# Patient Record
Sex: Female | Born: 1950 | Race: White | Hispanic: No | Smoking: Never smoker
Health system: Southern US, Community
[De-identification: ages and names within clinical notes are randomized; demographics above are authoritative.]

## PROBLEM LIST (undated history)

## (undated) DIAGNOSIS — I10 Essential (primary) hypertension: Secondary | ICD-10-CM

## (undated) DIAGNOSIS — E119 Type 2 diabetes mellitus without complications: Secondary | ICD-10-CM

## (undated) DIAGNOSIS — F32A Depression, unspecified: Secondary | ICD-10-CM

## (undated) DIAGNOSIS — I878 Other specified disorders of veins: Secondary | ICD-10-CM

## (undated) DIAGNOSIS — N183 Chronic kidney disease, stage 3 unspecified: Secondary | ICD-10-CM

## (undated) DIAGNOSIS — R001 Bradycardia, unspecified: Secondary | ICD-10-CM

## (undated) DIAGNOSIS — M109 Gout, unspecified: Secondary | ICD-10-CM

## (undated) DIAGNOSIS — I2510 Atherosclerotic heart disease of native coronary artery without angina pectoris: Secondary | ICD-10-CM

## (undated) DIAGNOSIS — G629 Polyneuropathy, unspecified: Secondary | ICD-10-CM

## (undated) HISTORY — PX: NASAL SEPTUM SURGERY: SHX37

---

## 2012-01-20 DIAGNOSIS — Z950 Presence of cardiac pacemaker: Secondary | ICD-10-CM

## 2012-01-20 HISTORY — DX: Presence of cardiac pacemaker: Z95.0

## 2012-01-20 HISTORY — PX: INSERT / REPLACE / REMOVE PACEMAKER: SUR710

## 2017-05-12 ENCOUNTER — Ambulatory Visit: Payer: Self-pay | Admitting: Primary Care

## 2017-05-25 ENCOUNTER — Other Ambulatory Visit: Payer: Self-pay

## 2017-05-25 ENCOUNTER — Ambulatory Visit: Payer: Self-pay

## 2017-05-25 ENCOUNTER — Ambulatory Visit (INDEPENDENT_AMBULATORY_CARE_PROVIDER_SITE_OTHER): Payer: Medicare Other | Admitting: Podiatry

## 2017-05-25 DIAGNOSIS — B351 Tinea unguium: Secondary | ICD-10-CM

## 2017-05-25 DIAGNOSIS — M79676 Pain in unspecified toe(s): Secondary | ICD-10-CM

## 2017-05-25 DIAGNOSIS — E0843 Diabetes mellitus due to underlying condition with diabetic autonomic (poly)neuropathy: Secondary | ICD-10-CM | POA: Diagnosis not present

## 2017-05-25 DIAGNOSIS — L02611 Cutaneous abscess of right foot: Secondary | ICD-10-CM

## 2017-05-25 DIAGNOSIS — L03031 Cellulitis of right toe: Principal | ICD-10-CM

## 2017-05-27 NOTE — Progress Notes (Signed)
   SUBJECTIVE Patient with a history of diabetes mellitus presents to office today complaining of elongated, thickened nails that cause pain while ambulating in shoes. She is unable to trim her own nails. Patient is here for further evaluation and treatment.   No past medical history on file.  OBJECTIVE General Patient is awake, alert, and oriented x 3 and in no acute distress. Derm Skin is dry and supple bilateral. Negative open lesions or macerations. Remaining integument unremarkable. Nails are tender, long, thickened and dystrophic with subungual debris, consistent with onychomycosis, 1-5 bilateral. No signs of infection noted. Vasc  DP and PT pedal pulses palpable bilaterally. Temperature gradient within normal limits.  Neuro Epicritic and protective threshold sensation diminished bilaterally.  Musculoskeletal Exam No symptomatic pedal deformities noted bilateral. Muscular strength within normal limits.  ASSESSMENT 1. Diabetes Mellitus w/ peripheral neuropathy 2. Onychomycosis of nail due to dermatophyte bilateral 3. Pain in foot bilateral  PLAN OF CARE 1. Patient evaluated today. 2. Instructed to maintain good pedal hygiene and foot care. Stressed importance of controlling blood sugar.  3. Mechanical debridement of nails 1-5 bilaterally performed using a nail nipper. Filed with dremel without incident.  4. Return to clinic in 3 mos.     Felecia Shelling, DPM [facility]  Dr. Felecia Shelling, DPM    [address]                                        [place], [place] [address]                Office [phone]  Fax [phone]

## 2017-06-09 ENCOUNTER — Ambulatory Visit: Payer: Self-pay | Admitting: Family Medicine

## 2017-06-28 ENCOUNTER — Ambulatory Visit: Payer: Self-pay | Admitting: Family Medicine

## 2017-08-26 ENCOUNTER — Encounter: Payer: Self-pay | Admitting: Podiatry

## 2017-08-26 ENCOUNTER — Ambulatory Visit (INDEPENDENT_AMBULATORY_CARE_PROVIDER_SITE_OTHER): Payer: Medicare Other | Admitting: Podiatry

## 2017-08-26 DIAGNOSIS — B351 Tinea unguium: Secondary | ICD-10-CM

## 2017-08-26 DIAGNOSIS — E0843 Diabetes mellitus due to underlying condition with diabetic autonomic (poly)neuropathy: Secondary | ICD-10-CM

## 2017-08-26 DIAGNOSIS — M79676 Pain in unspecified toe(s): Secondary | ICD-10-CM

## 2017-08-26 NOTE — Progress Notes (Signed)
Complaint:  Visit Type: Patient returns to my office for continued preventative foot care services. Complaint: Patient states" my nails have grown long and thick and become painful to walk and wear shoes"  The patient presents for preventative foot care services. No changes to ROS.  Patient says her compression socks are working well.  Podiatric Exam: Vascular: dorsalis pedis and posterior tibial pulses are palpable bilateral. Capillary return is immediate. Temperature gradient is WNL. Skin turgor WNL  Sensorium: Normal Semmes Weinstein monofilament test. Normal tactile sensation bilaterally. Nail Exam: Pt has thick disfigured discolored nails with subungual debris noted bilateral entire nail hallux through fifth toenails Ulcer Exam: There is no evidence of ulcer or pre-ulcerative changes or infection. Orthopedic Exam: Muscle tone and strength are WNL. No limitations in general ROM. No crepitus or effusions noted. Foot type and digits show no abnormalities. HAV  B/L. Skin: No Porokeratosis. No infection or ulcers.  Asymptomatic callus sub 1  B/l.  Diagnosis:  Onychomycosis, , Pain in right toe, pain in left toes  Treatment & Plan Procedures and Treatment: Consent by patient was obtained for treatment procedures.   Debridement of mycotic and hypertrophic toenails, 1 through 5 bilateral and clearing of subungual debris. No ulceration, no infection noted. ABN signed for 2019. Return Visit-Office Procedure: Patient instructed to return to the office for a follow up visit 3 months for continued evaluation and treatment.    Helane GuntherGregory Jahira Swiss DPM

## 2017-11-15 ENCOUNTER — Other Ambulatory Visit: Payer: Self-pay

## 2017-11-15 ENCOUNTER — Emergency Department
Admission: EM | Admit: 2017-11-15 | Discharge: 2017-11-16 | Disposition: A | Discharge status: Home / Self Care | Payer: Medicare Other | Attending: Emergency Medicine | Admitting: Emergency Medicine

## 2017-11-15 ENCOUNTER — Encounter: Payer: Self-pay | Admitting: Emergency Medicine

## 2017-11-15 DIAGNOSIS — E860 Dehydration: Secondary | ICD-10-CM | POA: Insufficient documentation

## 2017-11-15 DIAGNOSIS — Z91040 Latex allergy status: Secondary | ICD-10-CM | POA: Insufficient documentation

## 2017-11-15 DIAGNOSIS — R739 Hyperglycemia, unspecified: Secondary | ICD-10-CM

## 2017-11-15 DIAGNOSIS — E1165 Type 2 diabetes mellitus with hyperglycemia: Secondary | ICD-10-CM | POA: Insufficient documentation

## 2017-11-15 DIAGNOSIS — Z7982 Long term (current) use of aspirin: Secondary | ICD-10-CM | POA: Diagnosis not present

## 2017-11-15 DIAGNOSIS — Z79899 Other long term (current) drug therapy: Secondary | ICD-10-CM | POA: Insufficient documentation

## 2017-11-15 HISTORY — DX: Type 2 diabetes mellitus without complications: E11.9

## 2017-11-15 LAB — URINALYSIS, COMPLETE (UACMP) WITH MICROSCOPIC
BACTERIA UA: NONE SEEN
Bilirubin Urine: NEGATIVE
Hgb urine dipstick: NEGATIVE
KETONES UR: NEGATIVE mg/dL
Leukocytes, UA: NEGATIVE
NITRITE: NEGATIVE
Protein, ur: NEGATIVE mg/dL
SPECIFIC GRAVITY, URINE: 1.022 (ref 1.005–1.030)
pH: 5 (ref 5.0–8.0)

## 2017-11-15 LAB — CBC
HEMATOCRIT: 45.6 % (ref 36.0–46.0)
Hemoglobin: 15.4 g/dL — ABNORMAL HIGH (ref 12.0–15.0)
MCH: 30.1 pg (ref 26.0–34.0)
MCHC: 33.8 g/dL (ref 30.0–36.0)
MCV: 89.1 fL (ref 80.0–100.0)
Platelets: 249 10*3/uL (ref 150–400)
RBC: 5.12 MIL/uL — ABNORMAL HIGH (ref 3.87–5.11)
RDW: 11.4 % — ABNORMAL LOW (ref 11.5–15.5)
WBC: 9.9 10*3/uL (ref 4.0–10.5)
nRBC: 0 % (ref 0.0–0.2)

## 2017-11-15 LAB — BASIC METABOLIC PANEL
Anion gap: 14 (ref 5–15)
BUN: 36 mg/dL — AB (ref 8–23)
CHLORIDE: 90 mmol/L — AB (ref 98–111)
CO2: 21 mmol/L — ABNORMAL LOW (ref 22–32)
CREATININE: 1.77 mg/dL — AB (ref 0.44–1.00)
Calcium: 9.4 mg/dL (ref 8.9–10.3)
GFR calc Af Amer: 34 mL/min — ABNORMAL LOW (ref >60)
GFR calc non Af Amer: 29 mL/min — ABNORMAL LOW (ref >60)
Glucose, Bld: 772 mg/dL (ref 70–99)
Potassium: 4.3 mmol/L (ref 3.5–5.1)
SODIUM: 125 mmol/L — AB (ref 135–145)

## 2017-11-15 LAB — GLUCOSE, CAPILLARY
GLUCOSE-CAPILLARY: 293 mg/dL — AB (ref 70–99)
Glucose-Capillary: >600 mg/dL (ref 70–99)
Glucose-Capillary: >600 mg/dL (ref 70–99)
Glucose-Capillary: 509 mg/dL (ref 70–99)

## 2017-11-15 MED ORDER — SODIUM CHLORIDE 0.9 % IV BOLUS: 1000.0000 mL | Freq: Once | INTRAVENOUS | Status: DC

## 2017-11-15 MED ORDER — NATEGLINIDE 120 MG PO TABS: 60.0000 mg | ORAL_TABLET | Freq: Once | ORAL | Status: DC

## 2017-11-15 MED ORDER — SODIUM CHLORIDE 0.9 % IV BOLUS
1000.0000 mL | Freq: Once | INTRAVENOUS | Status: AC
  Administered 2017-11-15: 1000 mL via INTRAVENOUS

## 2017-11-15 MED ORDER — SODIUM CHLORIDE 0.9 % IV SOLN
Freq: Once | INTRAVENOUS | Status: AC
  Administered 2017-11-15: 23:00:00 via INTRAVENOUS

## 2017-11-15 MED ORDER — INSULIN ASPART 100 UNIT/ML ~~LOC~~ SOLN
10.0000 [IU] | Freq: Once | SUBCUTANEOUS | Status: AC
  Administered 2017-11-15: 10 [IU] via INTRAVENOUS
  Filled 2017-11-15: qty 1

## 2017-11-15 MED ORDER — INSULIN ASPART 100 UNIT/ML ~~LOC~~ SOLN
SUBCUTANEOUS | Status: AC
  Administered 2017-11-15: 10 [IU] via INTRAVENOUS
  Filled 2017-11-15: qty 1

## 2017-11-15 MED ORDER — INSULIN ASPART 100 UNIT/ML ~~LOC~~ SOLN
10.0000 [IU] | Freq: Once | SUBCUTANEOUS | Status: AC
  Administered 2017-11-15: 10 [IU] via INTRAVENOUS

## 2017-11-15 MED ORDER — NATEGLINIDE 60 MG PO TABS: 60.0000 mg | ORAL_TABLET | Freq: Three times a day (TID) | ORAL | 1 refills | Status: DC

## 2017-11-15 NOTE — ED Triage Notes (Signed)
Pt via pov from [facility] with hyperglycemia. She had routine appt today and was called back and told that her blood glucose was over 700. Pt has recently moved to the area and has not established care with a local physician. She has been on (possibly) glipizide until she ran out in march or April. Pt alert & oriented; nad noted.

## 2017-11-15 NOTE — ED Notes (Signed)
Pt with no change in condition, no complaints at this time.

## 2017-11-15 NOTE — ED Provider Notes (Addendum)
[facility] Emergency Department Provider Note  ____________________________________________  Time seen: Approximately 11:06 PM  I have reviewed the triage vital signs and the nursing notes.   HISTORY  Chief Complaint Hyperglycemia   HPI Debbie Wiggins is a 67 y.o. female with a history of type 2 diabetes who presents for evaluation of a hyperglycemia.  Patient reports that she recently moved from [place] to [place] and lost her insurance.  She has been off of her oral glycemic agents since April.  She recently was able to get insurance again and today had a regular PCP appointment with labs drawn. Patient was called by the PCP this afternoon telling her to come to the emergency room for sugar greater than 700.  Patient denies abdominal pain, chest pain, dizziness, headache, confusion.  She does report polyuria polydipsia however she says that is chronic for her.  Patient reports that her last creatinine in [place] was 1.2.   Past Medical History:  Diagnosis Date  . Diabetes mellitus without complication (HCC)     Prior to Admission medications   Medication Sig Start Date End Date Taking? Authorizing Provider  aspirin EC 81 MG tablet Take 81 mg by mouth daily.    [provider]  carvedilol (COREG) 6.25 MG tablet Take 6.25 mg by mouth 2 (two) times daily with a meal.    [provider]  colchicine 0.6 MG tablet Take 0.6 mg by mouth daily.    [provider]  hydrochlorothiazide (HYDRODIURIL) 25 MG tablet Take 25 mg by mouth daily. 05/10/17   [provider]  latanoprost (XALATAN) 0.005 % ophthalmic solution  05/13/17   [provider]  losartan (COZAAR) 100 MG tablet Take 100 mg by mouth daily. 05/10/17   [provider]  nateglinide (STARLIX) 60 MG tablet Take 1 tablet (60 mg total) by mouth 3 (three) times daily with meals. 11/15/17   Nita Sickle, MD  rosuvastatin (CRESTOR) 10 MG tablet Take 10  mg by mouth daily.    [provider]  SODIUM BICARBONATE PO Take by mouth.    [provider]    Allergies Ceftin [cefuroxime axetil]; Latex; Penicillins; and Sulfa antibiotics  History reviewed. No pertinent family history.  Social History Social History   Tobacco Use  . Smoking status: Never Smoker  . Smokeless tobacco: Never Used  Substance Use Topics  . Alcohol use: Never    Frequency: Never  . Drug use: Never    Review of Systems  Constitutional: Negative for fever. Eyes: Negative for visual changes. ENT: Negative for sore throat. Neck: No neck pain  Cardiovascular: Negative for chest pain. Respiratory: Negative for shortness of breath. Gastrointestinal: Negative for abdominal pain, vomiting or diarrhea. Genitourinary: Negative for dysuria. Musculoskeletal: Negative for back pain. Skin: Negative for rash. Neurological: Negative for headaches, weakness or numbness. Psych: No SI or HI  ____________________________________________   PHYSICAL EXAM:  VITAL SIGNS: ED Triage Vitals  Enc Vitals Group     BP 11/15/17 1801 100/61     Pulse Rate 11/15/17 1801 90     Resp --      Temp 11/15/17 1801 98.9 F (37.2 C)     Temp Source 11/15/17 1801 Oral     SpO2 11/15/17 1801 93 %     Weight 11/15/17 1802 247 lb (112 kg)     Height 11/15/17 1802 5\' 5"  (1.651 m)     Head Circumference --      Peak Flow --  Pain Score 11/15/17 1823 0     Pain Loc --      Pain Edu? --      Excl. in GC? --     Constitutional: Alert and oriented. Well appearing and in no apparent distress. HEENT:      Head: Normocephalic and atraumatic.         Eyes: Conjunctivae are normal. Sclera is non-icteric.       Mouth/Throat: Mucous membranes are moist.       Neck: Supple with no signs of meningismus. Cardiovascular: Regular rate and rhythm. No murmurs, gallops, or rubs. 2+ symmetrical distal pulses are present in all extremities. No JVD. Respiratory: Normal  respiratory effort. Lungs are clear to auscultation bilaterally. No wheezes, crackles, or rhonchi.  Gastrointestinal: Soft, non tender, and non distended with positive bowel sounds. No rebound or guarding. Musculoskeletal: Nontender with normal range of motion in all extremities. No edema, cyanosis, or erythema of extremities. Neurologic: Normal speech and language. Face is symmetric. Moving all extremities. No gross focal neurologic deficits are appreciated. Skin: Skin is warm, dry and intact. No rash noted. Psychiatric: Mood and affect are normal. Speech and behavior are normal.  ____________________________________________   LABS (all labs ordered are listed, but only abnormal results are displayed)  Labs Reviewed  GLUCOSE, CAPILLARY - Abnormal; Notable for the following components:      Result Value   Glucose-Capillary >600 (*)    All other components within normal limits  BASIC METABOLIC PANEL - Abnormal; Notable for the following components:   Sodium 125 (*)    Chloride 90 (*)    CO2 21 (*)    Glucose, Bld 772 (*)    BUN 36 (*)    Creatinine, Ser 1.77 (*)    GFR calc non Af Amer 29 (*)    GFR calc Af Amer 34 (*)    All other components within normal limits  CBC - Abnormal; Notable for the following components:   RBC 5.12 (*)    Hemoglobin 15.4 (*)    RDW 11.4 (*)    All other components within normal limits  URINALYSIS, COMPLETE (UACMP) WITH MICROSCOPIC - Abnormal; Notable for the following components:   Color, Urine STRAW (*)    APPearance CLEAR (*)    Glucose, UA >=500 (*)    All other components within normal limits  GLUCOSE, CAPILLARY - Abnormal; Notable for the following components:   Glucose-Capillary >600 (*)    All other components within normal limits  GLUCOSE, CAPILLARY - Abnormal; Notable for the following components:   Glucose-Capillary 509 (*)    All other components within normal limits  CBG MONITORING, ED    ____________________________________________  EKG  none  ____________________________________________  RADIOLOGY  none ____________________________________________   PROCEDURES  Procedure(s) performed: None Procedures Critical Care performed:  Yes  CRITICAL CARE Performed by: Nita Sickle  ?  Total critical care time: 35 min  Critical care time was exclusive of separately billable procedures and treating other patients.  Critical care was necessary to treat or prevent imminent or life-threatening deterioration.  Critical care was time spent personally by me on the following activities: development of treatment plan with patient and/or surrogate as well as nursing, discussions with consultants, evaluation of patient's response to treatment, examination of patient, obtaining history from patient or surrogate, ordering and performing treatments and interventions, ordering and review of laboratory studies, ordering and review of radiographic studies, pulse oximetry and re-evaluation of patient's condition.  ____________________________________________   INITIAL  IMPRESSION / ASSESSMENT AND PLAN / ED COURSE  67 y.o. female with a history of type 2 diabetes who presents for evaluation of a hyperglycemia.  Patient blood glucose of 772, with no evidence of DKA with normal anion gap and no ketones.  Sodium of 125 which corrected per glucose is normal.  Creatinine of 1.77, per patient her baseline is 1.2.  Patient received 2 L of normal saline with 10 units of IV insulin which sugars improving to 509.  She is currently receiving third bolus with a repeat 10 units of insulin.  Plan to discharge her home and restart her on her oral agent with close follow-up with her doctor tomorrow for possible initiation of insulin.  Care transferred to Dr. Lamont Snowball.      As part of my medical decision making, I reviewed the following data within the electronic MEDICAL RECORD NUMBER Nursing notes  reviewed and incorporated, Labs reviewed , Notes from prior ED visits and [place] Controlled Substance Database    Pertinent labs & imaging results that were available during my care of the patient were reviewed by me and considered in my medical decision making (see chart for details).    ____________________________________________   FINAL CLINICAL IMPRESSION(S) / ED DIAGNOSES  Final diagnoses:  Hyperglycemia  Dehydration      NEW MEDICATIONS STARTED DURING THIS VISIT:  ED Discharge Orders         Ordered    nateglinide (STARLIX) 60 MG tablet  3 times daily with meals     11/15/17 2324           Note:  This document was prepared using Dragon voice recognition software and may include unintentional dictation errors.    Nita Sickle, MD 11/15/17 2325    Don Perking, [place], MD 11/24/17 [phone]

## 2017-11-15 NOTE — Discharge Instructions (Addendum)
Results for orders placed or performed during the hospital encounter of 11/15/17  Glucose, capillary  Result Value Ref Range   Glucose-Capillary >600 (HH) 70 - 99 mg/dL  Basic metabolic panel  Result Value Ref Range   Sodium 125 (L) 135 - 145 mmol/L   Potassium 4.3 3.5 - 5.1 mmol/L   Chloride 90 (L) 98 - 111 mmol/L   CO2 21 (L) 22 - 32 mmol/L   Glucose, Bld 772 (HH) 70 - 99 mg/dL   BUN 36 (H) 8 - 23 mg/dL   Creatinine, Ser 1.61 (H) 0.44 - 1.00 mg/dL   Calcium 9.4 8.9 - 09.6 mg/dL   GFR calc non Af Amer 29 (L) >60 mL/min   GFR calc Af Amer 34 (L) >60 mL/min   Anion gap 14 5 - 15  CBC  Result Value Ref Range   WBC 9.9 4.0 - 10.5 K/uL   RBC 5.12 (H) 3.87 - 5.11 MIL/uL   Hemoglobin 15.4 (H) 12.0 - 15.0 g/dL   HCT 04.5 40.9 - 81.1 %   MCV 89.1 80.0 - 100.0 fL   MCH 30.1 26.0 - 34.0 pg   MCHC 33.8 30.0 - 36.0 g/dL   RDW 91.4 (L) 78.2 - 95.6 %   Platelets 249 150 - 400 K/uL   nRBC 0.0 0.0 - 0.2 %  Urinalysis, Complete w Microscopic  Result Value Ref Range   Color, Urine STRAW (A) YELLOW   APPearance CLEAR (A) CLEAR   Specific Gravity, Urine 1.022 1.005 - 1.030   pH 5.0 5.0 - 8.0   Glucose, UA >=500 (A) NEGATIVE mg/dL   Hgb urine dipstick NEGATIVE NEGATIVE   Bilirubin Urine NEGATIVE NEGATIVE   Ketones, ur NEGATIVE NEGATIVE mg/dL   Protein, ur NEGATIVE NEGATIVE mg/dL   Nitrite NEGATIVE NEGATIVE   Leukocytes, UA NEGATIVE NEGATIVE   RBC / HPF 6-10 0 - 5 RBC/hpf   WBC, UA 0-5 0 - 5 WBC/hpf   Bacteria, UA NONE SEEN NONE SEEN   Squamous Epithelial / LPF 0-5 0 - 5  Glucose, capillary  Result Value Ref Range   Glucose-Capillary >600 (HH) 70 - 99 mg/dL   Comment 1 Notify RN    Comment 2 Document in Chart   Glucose, capillary  Result Value Ref Range   Glucose-Capillary 509 (HH) 70 - 99 mg/dL   Comment 1 Document in Chart    Comment 2 Call MD NNP PA CNM   Glucose, capillary  Result Value Ref Range   Glucose-Capillary 293 (H) 70 - 99 mg/dL   No results found.

## 2017-11-15 NOTE — ED Notes (Signed)
Pt in with co hyperglycemia and states ran out of oral meds in April. Pt not able to get in with pmd to refill.  Pt denies any symptoms states feels normal, states went to pmd today as new pt and they ran blood work and was called at home to come in due to fsbs.

## 2017-11-16 NOTE — ED Provider Notes (Signed)
Sugar 293.  Discharged according to Dr. Arbutus Leas plan.   Merrily Brittle, MD 11/16/17 [phone]

## 2017-11-16 NOTE — ED Notes (Signed)
Patient discharged to home per MD order. Patient in stable condition, and deemed medically cleared by ED provider for discharge. Discharge instructions reviewed with patient/family using "Teach Back"; verbalized understanding of medication education and administration, and information about follow-up care. Denies further concerns. ° °

## 2017-11-25 ENCOUNTER — Ambulatory Visit (INDEPENDENT_AMBULATORY_CARE_PROVIDER_SITE_OTHER): Payer: Medicare Other | Admitting: Podiatry

## 2017-11-25 ENCOUNTER — Encounter: Payer: Self-pay | Admitting: Podiatry

## 2017-11-25 DIAGNOSIS — B351 Tinea unguium: Secondary | ICD-10-CM | POA: Diagnosis not present

## 2017-11-25 DIAGNOSIS — M79676 Pain in unspecified toe(s): Secondary | ICD-10-CM | POA: Diagnosis not present

## 2017-11-25 DIAGNOSIS — E0843 Diabetes mellitus due to underlying condition with diabetic autonomic (poly)neuropathy: Secondary | ICD-10-CM | POA: Diagnosis not present

## 2017-11-25 NOTE — Progress Notes (Signed)
Complaint:  Visit Type: Patient returns to my office for continued preventative foot care services. Complaint: Patient states" my nails have grown long and thick and become painful to walk and wear shoes"  The patient presents for preventative foot care services. No changes to ROS.  Patient says her compression socks are working well.  Podiatric Exam: Vascular: dorsalis pedis and posterior tibial pulses are palpable bilateral. Capillary return is immediate. Temperature gradient is WNL. Skin turgor WNL  Sensorium: Normal Semmes Weinstein monofilament test. Normal tactile sensation bilaterally. Nail Exam: Pt has thick disfigured discolored nails with subungual debris noted bilateral entire nail hallux through fifth toenails Ulcer Exam: There is no evidence of ulcer or pre-ulcerative changes or infection. Orthopedic Exam: Muscle tone and strength are WNL. No limitations in general ROM. No crepitus or effusions noted. Foot type and digits show no abnormalities. HAV  B/L. Skin: No Porokeratosis. No infection or ulcers.  Asymptomatic callus sub 1  B/l.  Diagnosis:  Onychomycosis, , Pain in right toe, pain in left toes  Treatment & Plan Procedures and Treatment: Consent by patient was obtained for treatment procedures.   Debridement of mycotic and hypertrophic toenails, 1 through 5 bilateral and clearing of subungual debris. No ulceration, no infection noted. ABN signed for 2019. Return Visit-Office Procedure: Patient instructed to return to the office for a follow up visit 3 months for continued evaluation and treatment.    Debbie Wiggins DPM 

## 2017-12-08 DIAGNOSIS — I10 Essential (primary) hypertension: Secondary | ICD-10-CM | POA: Insufficient documentation

## 2017-12-08 DIAGNOSIS — Z950 Presence of cardiac pacemaker: Secondary | ICD-10-CM | POA: Insufficient documentation

## 2017-12-24 ENCOUNTER — Other Ambulatory Visit: Payer: Self-pay | Admitting: Nephrology

## 2017-12-24 DIAGNOSIS — N183 Chronic kidney disease, stage 3 unspecified: Secondary | ICD-10-CM

## 2017-12-29 ENCOUNTER — Ambulatory Visit
Admission: RE | Admit: 2017-12-29 | Discharge: 2017-12-29 | Disposition: A | Discharge status: Home / Self Care | Payer: Medicare Other | Source: Ambulatory Visit | Attending: Nephrology | Admitting: Nephrology

## 2017-12-29 DIAGNOSIS — N183 Chronic kidney disease, stage 3 unspecified: Secondary | ICD-10-CM

## 2017-12-29 DIAGNOSIS — N261 Atrophy of kidney (terminal): Secondary | ICD-10-CM | POA: Insufficient documentation

## 2018-02-15 ENCOUNTER — Other Ambulatory Visit (INDEPENDENT_AMBULATORY_CARE_PROVIDER_SITE_OTHER): Payer: Self-pay | Admitting: Nephrology

## 2018-02-15 DIAGNOSIS — I10 Essential (primary) hypertension: Secondary | ICD-10-CM

## 2018-02-16 ENCOUNTER — Ambulatory Visit (INDEPENDENT_AMBULATORY_CARE_PROVIDER_SITE_OTHER): Payer: Medicare Other

## 2018-02-16 DIAGNOSIS — I10 Essential (primary) hypertension: Secondary | ICD-10-CM | POA: Diagnosis not present

## 2018-03-03 ENCOUNTER — Ambulatory Visit (INDEPENDENT_AMBULATORY_CARE_PROVIDER_SITE_OTHER): Payer: Medicare Other | Admitting: Podiatry

## 2018-03-03 ENCOUNTER — Encounter: Payer: Self-pay | Admitting: Podiatry

## 2018-03-03 DIAGNOSIS — M79676 Pain in unspecified toe(s): Secondary | ICD-10-CM

## 2018-03-03 DIAGNOSIS — E0843 Diabetes mellitus due to underlying condition with diabetic autonomic (poly)neuropathy: Secondary | ICD-10-CM | POA: Diagnosis not present

## 2018-03-03 DIAGNOSIS — B351 Tinea unguium: Secondary | ICD-10-CM | POA: Diagnosis not present

## 2018-03-03 NOTE — Progress Notes (Signed)
Complaint:  Visit Type: Patient returns to my office for continued preventative foot care services. Complaint: Patient states" my nails have grown long and thick and become painful to walk and wear shoes"  The patient presents for preventative foot care services. No changes to ROS.  Patient says her compression socks are working well.  Podiatric Exam: Vascular: dorsalis pedis and posterior tibial pulses are palpable bilateral. Capillary return is immediate. Temperature gradient is WNL. Skin turgor WNL  Sensorium: Normal Semmes Weinstein monofilament test. Normal tactile sensation bilaterally. Nail Exam: Pt has thick disfigured discolored nails with subungual debris noted bilateral entire nail hallux through fifth toenails Ulcer Exam: There is no evidence of ulcer or pre-ulcerative changes or infection. Orthopedic Exam: Muscle tone and strength are WNL. No limitations in general ROM. No crepitus or effusions noted. Foot type and digits show no abnormalities. HAV  B/L. Skin: No Porokeratosis. No infection or ulcers.  Asymptomatic callus sub 1  B/l.  Diagnosis:  Onychomycosis, , Pain in right toe, pain in left toes  Treatment & Plan Procedures and Treatment: Consent by patient was obtained for treatment procedures.   Debridement of mycotic and hypertrophic toenails, 1 through 5 bilateral and clearing of subungual debris. No ulceration, no infection noted.  Return Visit-Office Procedure: Patient instructed to return to the office for a follow up visit 3 months for continued evaluation and treatment.    Helane Gunther DPM

## 2018-06-02 ENCOUNTER — Ambulatory Visit: Payer: Medicare Other | Admitting: Podiatry

## 2018-07-28 ENCOUNTER — Ambulatory Visit: Payer: Medicare Other | Admitting: Podiatry

## 2018-12-12 ENCOUNTER — Other Ambulatory Visit: Payer: Self-pay

## 2018-12-12 ENCOUNTER — Encounter: Payer: Self-pay | Admitting: Podiatry

## 2018-12-12 ENCOUNTER — Ambulatory Visit: Payer: Medicare Other | Admitting: Podiatry

## 2018-12-12 DIAGNOSIS — M79676 Pain in unspecified toe(s): Secondary | ICD-10-CM

## 2018-12-12 DIAGNOSIS — B351 Tinea unguium: Secondary | ICD-10-CM

## 2018-12-12 DIAGNOSIS — E0843 Diabetes mellitus due to underlying condition with diabetic autonomic (poly)neuropathy: Secondary | ICD-10-CM | POA: Diagnosis not present

## 2018-12-12 NOTE — Progress Notes (Signed)
Complaint:  Visit Type: Patient returns to my office for continued preventative foot care services. Complaint: Patient states" my nails have grown long and thick and become painful to walk and wear shoes"  The patient presents for preventative foot care services. No changes to ROS. Patient has not been seen for 9 months.   Podiatric Exam: Vascular: dorsalis pedis and posterior tibial pulses are palpable bilateral. Capillary return is immediate. Temperature gradient is WNL. Skin turgor WNL  Sensorium: Normal Semmes Weinstein monofilament test. Normal tactile sensation bilaterally. Nail Exam: Pt has thick disfigured discolored nails with subungual debris noted bilateral entire nail hallux through fifth toenails Ulcer Exam: There is no evidence of ulcer or pre-ulcerative changes or infection. Orthopedic Exam: Muscle tone and strength are WNL. No limitations in general ROM. No crepitus or effusions noted. Foot type and digits show no abnormalities. HAV  B/L. Skin: No Porokeratosis. No infection or ulcers.  Diagnosis:  Onychomycosis, , Pain in right toe, pain in left toes  Treatment & Plan Procedures and Treatment: Consent by patient was obtained for treatment procedures.   Debridement of mycotic and hypertrophic toenails, 1 through 5 bilateral and clearing of subungual debris. No ulceration, no infection noted.  Return Visit-Office Procedure: Patient instructed to return to the office for a follow up visit 3 months for continued evaluation and treatment.    Gardiner Barefoot DPM

## 2018-12-14 DIAGNOSIS — E872 Acidosis, unspecified: Secondary | ICD-10-CM | POA: Insufficient documentation

## 2018-12-14 DIAGNOSIS — E1122 Type 2 diabetes mellitus with diabetic chronic kidney disease: Secondary | ICD-10-CM | POA: Insufficient documentation

## 2018-12-14 DIAGNOSIS — M109 Gout, unspecified: Secondary | ICD-10-CM | POA: Insufficient documentation

## 2018-12-14 DIAGNOSIS — I701 Atherosclerosis of renal artery: Secondary | ICD-10-CM | POA: Insufficient documentation

## 2019-01-15 IMAGING — US US RENAL
1 series · 14 of 25 positions shown · non-contrast
Comparison: None.

CLINICAL DATA: Chronic kidney disease

EXAM:
RENAL / URINARY TRACT ULTRASOUND COMPLETE

[Series 1: us renal · 0.30mm/px · 14 of 35 slices shown]
[im 1/35]
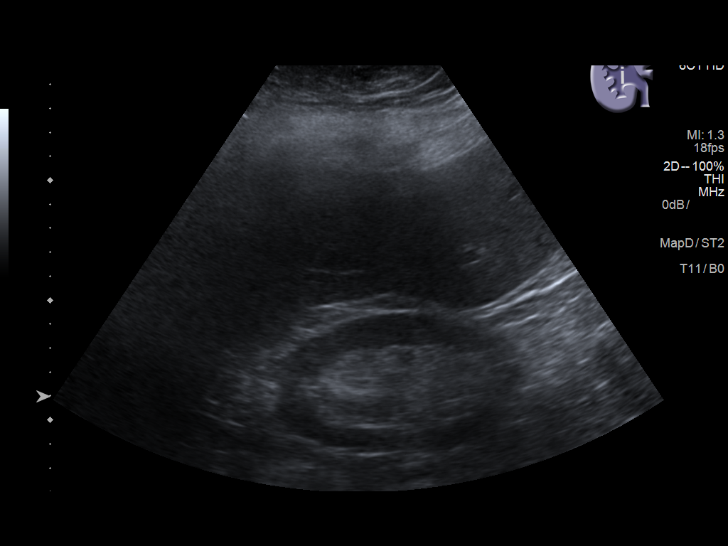
[im 3/35]
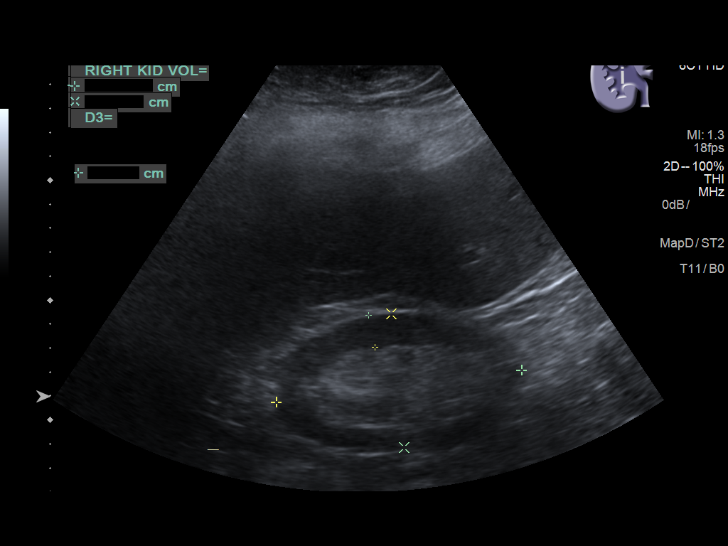
[im 6/35]
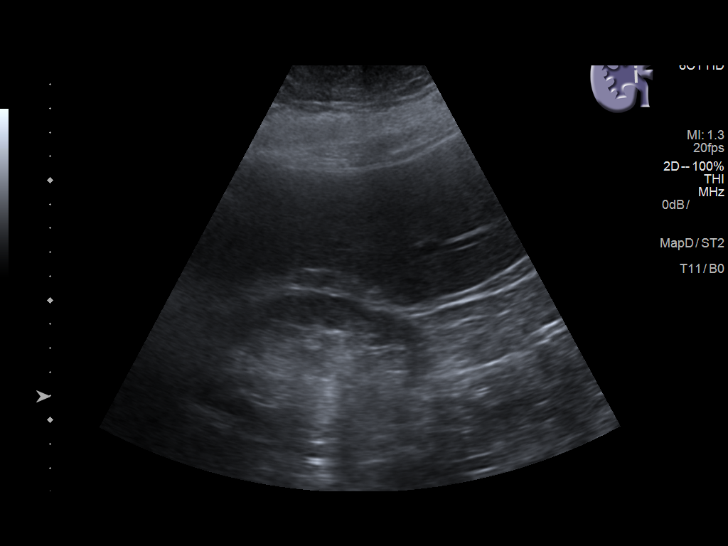
[im 9/35]
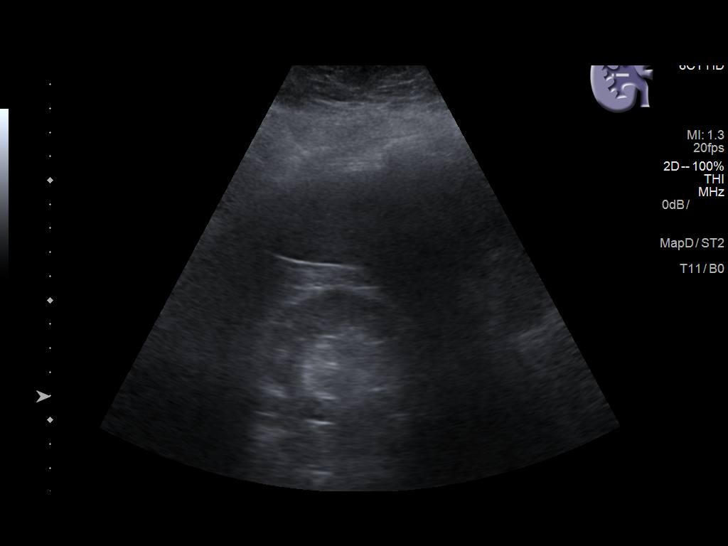
[im 12/35]
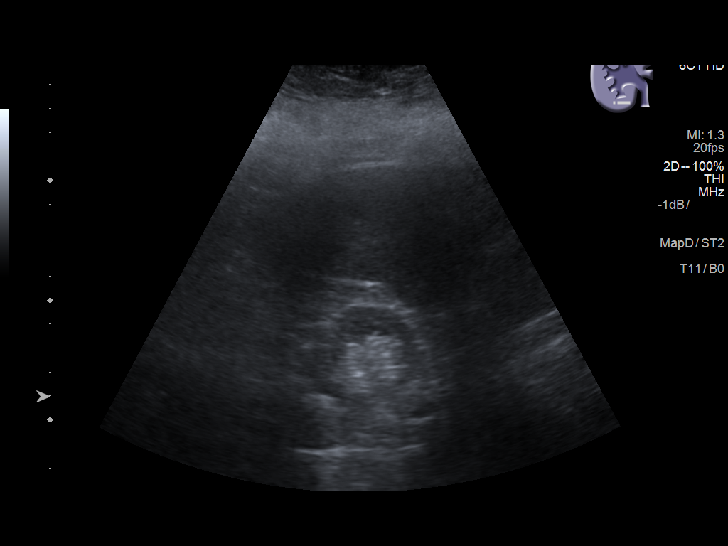
[im 13/35]
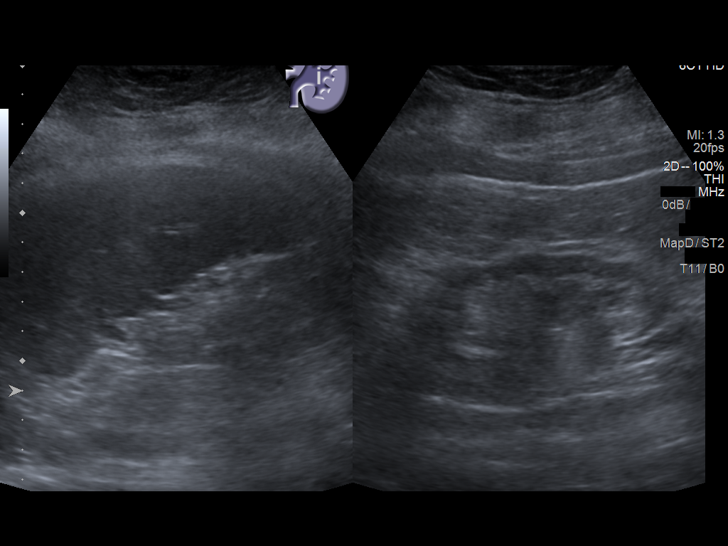
[im 16/35]
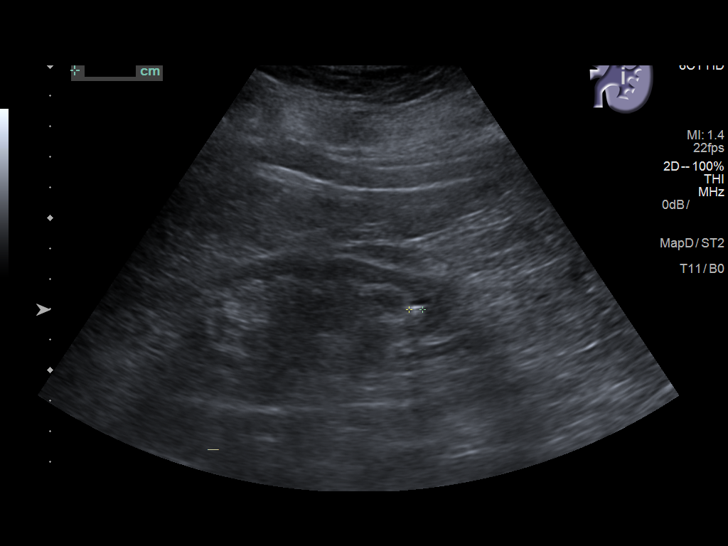
[im 19/35]
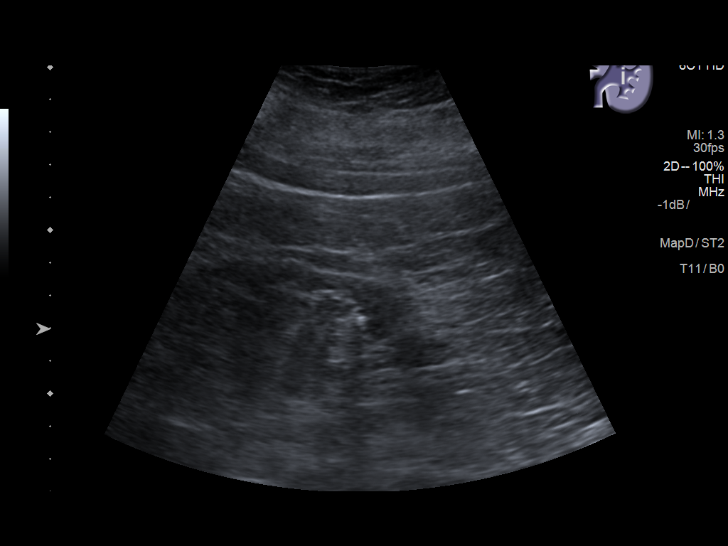
[im 22/35]
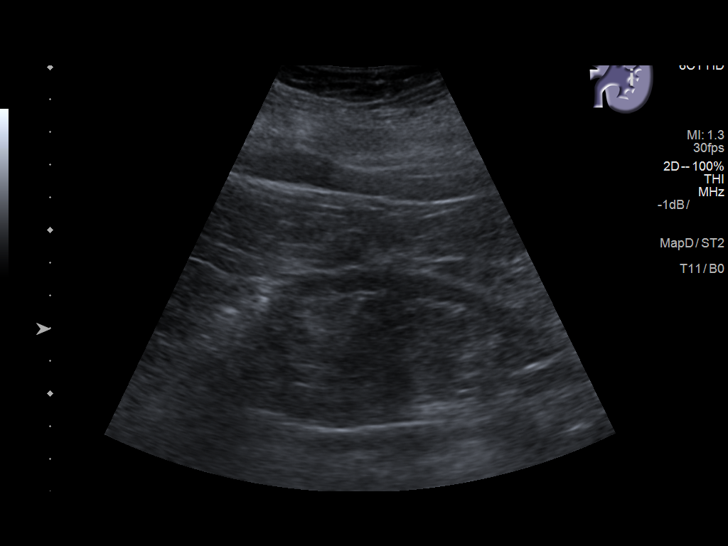
[im 23/35]
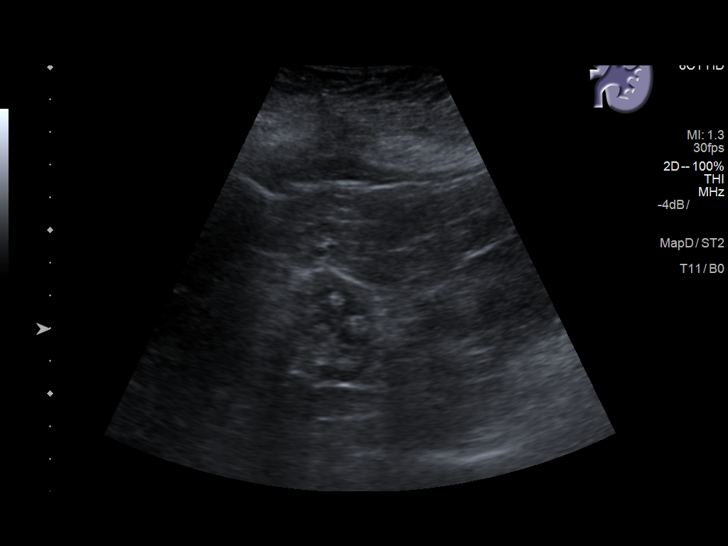
[im 26/35]
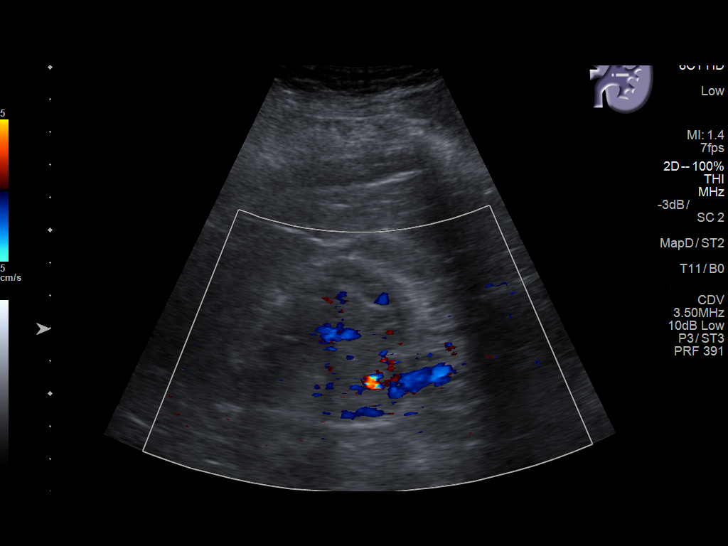
[im 29/35]
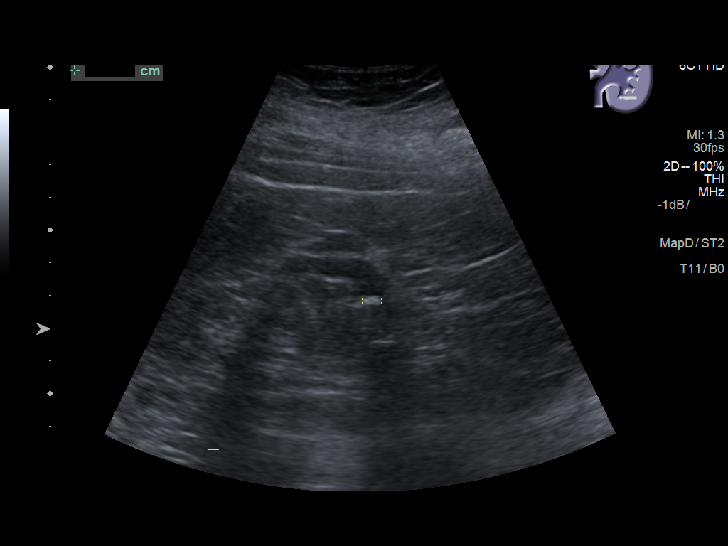
[im 32/35]
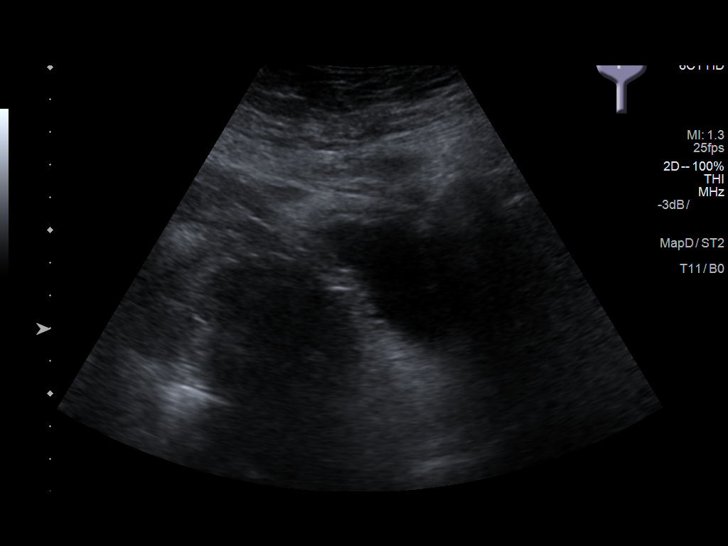
[im 35/35]
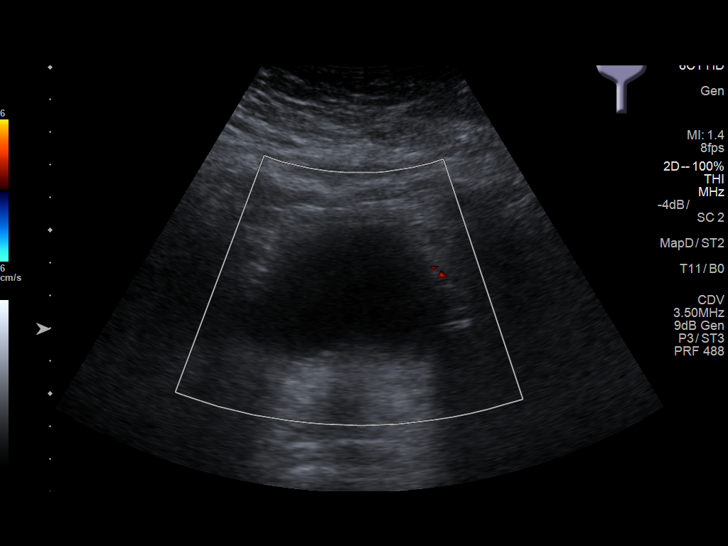

[14 of 25 positions shown; findings below may reference images not displayed]

FINDINGS: Right Kidney:

Renal measurements: 10.3 x 5.6 x 5.2 cm = volume: 158 mL .
Echogenicity within normal limits. No mass or hydronephrosis
visualized.

Left Kidney:

Renal measurements: 9.2 x 3.9 x 3.7 cm = volume: 69 mL. Renal
cortical thinning with increased renal cortical echogenicity.
Hypoechoic 6 mm focus in the lower pole of the left kidney likely
reflecting a renal calculus.

Bladder:

Appears normal for degree of bladder distention.
IMPRESSION: 1. Atrophic left kidney.
2. Normal right kidney.

## 2019-04-10 ENCOUNTER — Ambulatory Visit: Payer: Medicare Other | Admitting: Podiatry

## 2019-10-02 DIAGNOSIS — I129 Hypertensive chronic kidney disease with stage 1 through stage 4 chronic kidney disease, or unspecified chronic kidney disease: Secondary | ICD-10-CM | POA: Insufficient documentation

## 2019-10-02 DIAGNOSIS — N1832 Chronic kidney disease, stage 3b: Secondary | ICD-10-CM | POA: Insufficient documentation

## 2019-10-02 DIAGNOSIS — N200 Calculus of kidney: Secondary | ICD-10-CM | POA: Insufficient documentation

## 2019-10-02 DIAGNOSIS — N2581 Secondary hyperparathyroidism of renal origin: Secondary | ICD-10-CM | POA: Insufficient documentation

## 2020-07-11 DIAGNOSIS — E118 Type 2 diabetes mellitus with unspecified complications: Secondary | ICD-10-CM | POA: Diagnosis not present

## 2020-07-16 DIAGNOSIS — E119 Type 2 diabetes mellitus without complications: Secondary | ICD-10-CM | POA: Diagnosis not present

## 2020-07-16 DIAGNOSIS — H401134 Primary open-angle glaucoma, bilateral, indeterminate stage: Secondary | ICD-10-CM | POA: Diagnosis not present

## 2020-07-18 DIAGNOSIS — G4700 Insomnia, unspecified: Secondary | ICD-10-CM | POA: Diagnosis not present

## 2020-07-18 DIAGNOSIS — E1165 Type 2 diabetes mellitus with hyperglycemia: Secondary | ICD-10-CM | POA: Diagnosis not present

## 2020-07-18 DIAGNOSIS — I10 Essential (primary) hypertension: Secondary | ICD-10-CM | POA: Diagnosis not present

## 2020-08-29 DIAGNOSIS — N1832 Chronic kidney disease, stage 3b: Secondary | ICD-10-CM | POA: Diagnosis not present

## 2020-08-29 DIAGNOSIS — I129 Hypertensive chronic kidney disease with stage 1 through stage 4 chronic kidney disease, or unspecified chronic kidney disease: Secondary | ICD-10-CM | POA: Diagnosis not present

## 2020-08-29 DIAGNOSIS — B029 Zoster without complications: Secondary | ICD-10-CM | POA: Diagnosis not present

## 2020-10-02 ENCOUNTER — Ambulatory Visit: Payer: Medicare Other | Admitting: Podiatry

## 2020-10-02 ENCOUNTER — Encounter: Payer: Self-pay | Admitting: Podiatry

## 2020-10-02 ENCOUNTER — Other Ambulatory Visit: Payer: Self-pay

## 2020-10-02 DIAGNOSIS — L539 Erythematous condition, unspecified: Secondary | ICD-10-CM | POA: Diagnosis not present

## 2020-10-02 DIAGNOSIS — L97512 Non-pressure chronic ulcer of other part of right foot with fat layer exposed: Secondary | ICD-10-CM

## 2020-10-02 DIAGNOSIS — E0843 Diabetes mellitus due to underlying condition with diabetic autonomic (poly)neuropathy: Secondary | ICD-10-CM | POA: Diagnosis not present

## 2020-10-02 MED ORDER — DOXYCYCLINE HYCLATE 100 MG PO TABS: 100.0000 mg | ORAL_TABLET | Freq: Two times a day (BID) | ORAL | 0 refills | Status: AC

## 2020-10-02 NOTE — Progress Notes (Signed)
Subjective:  Patient ID: Debbie Wiggins, female    DOB: 05/23/1950,  MRN: [id number]  Chief Complaint  Patient presents with   Toe Injury    Right 3rd digit redness/inflammation/swelling.    70 y.o. female presents for wound care.  Patient presents with complaint of right third digit ulcer on the lateral side with redness associated with it.  There is been some superficial drainage as well.  Patient states that it got red and inflamed since Monday has progressed to gotten worse.  She had very thick nails which seem to be digging into the third digit leading to ulceration.  She is a diabetic with last A1c that is unknown.  She would like to discuss treatment options she has not seen anyone else prior to seeing me for this.  She denies any other acute complaints.   Review of Systems: Negative except as noted in the HPI. Denies N/V/F/Ch.  Past Medical History:  Diagnosis Date   Diabetes mellitus without complication ([place])     Current Outpatient Medications:    allopurinol (ZYLOPRIM) 100 MG tablet, Take 100 mg by mouth daily., Disp: , Rfl:    aspirin 81 MG EC tablet, Take by mouth., Disp: , Rfl:    BD INSULIN SYRINGE U/F 31G X 5/16" 0.3 ML MISC, 2 (two) times daily., Disp: , Rfl:    Blood Glucose Monitoring Suppl (FIFTY50 GLUCOSE METER 2.0) w/Device KIT, Use as directed (check blood glucose 3 times daily.), Disp: , Rfl:    Blood Pressure Monitoring (BLOOD PRESSURE CUFF) MISC, by Does not apply route., Disp: , Rfl:    carvedilol (COREG) 6.25 MG tablet, Take 6.25 mg by mouth 2 (two) times daily with a meal., Disp: , Rfl:    colchicine 0.6 MG tablet, Take 0.6 mg by mouth daily., Disp: , Rfl:    doxycycline (VIBRA-TABS) 100 MG tablet, Take 1 tablet (100 mg total) by mouth 2 (two) times daily for 14 days., Disp: 28 tablet, Rfl: 0   DULoxetine (CYMBALTA) 30 MG capsule, , Disp: , Rfl:    ergocalciferol (VITAMIN D2) 1.25 MG (50000 UT) capsule, Take by mouth., Disp: , Rfl:    HUMULIN 70/30 (70-30) 100  UNIT/ML injection, SMARTSIG:34 Unit(s) SUB-Q Twice Daily, Disp: , Rfl:    hydrochlorothiazide (HYDRODIURIL) 25 MG tablet, Take 25 mg by mouth daily., Disp: , Rfl: 10   Insulin Pen Needle (EXEL COMFORT POINT PEN NEEDLE) 29G X 12MM MISC, Use as directed, Disp: , Rfl:    Lancets (ONETOUCH DELICA PLUS [id number]) MISC, USE  TID UTD, Disp: , Rfl:    latanoprost (XALATAN) 0.005 % ophthalmic solution, , Disp: , Rfl:    latanoprost (XALATAN) 0.005 % ophthalmic solution, Apply to eye., Disp: , Rfl:    loratadine (CLARITIN) 10 MG tablet, Take by mouth., Disp: , Rfl:    losartan (COZAAR) 25 MG tablet, Take by mouth., Disp: , Rfl:    losartan (COZAAR) 50 MG tablet, Take 50 mg by mouth daily., Disp: , Rfl:    ONETOUCH VERIO test strip, USE TID UTD, Disp: , Rfl:    pioglitazone (ACTOS) 15 MG tablet, Take by mouth., Disp: , Rfl:    rosuvastatin (CRESTOR) 10 MG tablet, Take 10 mg by mouth daily., Disp: , Rfl:    sodium bicarbonate 650 MG tablet, TK 1 T PO BID, Disp: , Rfl:    sodium bicarbonate 650 MG tablet, Take by mouth., Disp: , Rfl:    SODIUM BICARBONATE PO, Take by mouth., Disp: , Rfl:  timolol (TIMOPTIC) 0.5 % ophthalmic solution, 1 drop every morning., Disp: , Rfl:    traZODone (DESYREL) 50 MG tablet, Take 50 mg by mouth at bedtime., Disp: , Rfl:    UNABLE TO FIND, Apply to eye., Disp: , Rfl:    valACYclovir (VALTREX) 1000 MG tablet, Take 1,000 mg by mouth 2 (two) times daily., Disp: , Rfl:   Social History   Tobacco Use  Smoking Status Never  Smokeless Tobacco Never    Allergies  Allergen Reactions   Ceftin [Cefuroxime Axetil] Swelling and Rash   Cefuroxime Other (See Comments), Rash and Swelling   Latex Swelling, Rash and Other (See Comments)   Nickel Rash   Penicillins Swelling, Rash and Other (See Comments)   Sulfa Antibiotics Swelling and Rash   Objective:  There were no vitals filed for this visit. There is no height or weight on file to calculate BMI. Constitutional Well  developed. Well nourished.  Vascular Dorsalis pedis pulses faintly palpable bilaterally. Posterior tibial pulses  faintly palpable bilaterally. Capillary refill normal to all digits.  No cyanosis or clubbing noted. Pedal hair growth normal.  Neurologic Normal speech. Oriented to person, place, and time. Protective sensation absent  Dermatologic Wound Location: Right third digit ulceration probing down to deep tissue redness associated with it up to the level of the interphalangeal joint.  No purulent drainage noted no malodor present.  Thickness of the tail nail was jamming into the third digit leading to the ulceration Wound Base: Mixed Granular/Fibrotic Peri-wound: Reddened Exudate: Scant/small amount Serosanguinous exudate Wound Measurements: -See below  Orthopedic: No pain to palpation either foot.   Radiographs: None Assessment:   1. 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 Skin ulcer of third toe of right foot with fat layer exposed ([place])   3. Erythema    Plan:  Patient was evaluated and treated and all questions answered.  Ulcer right third digit ulceration with fat layer exposed with underlying erythema -Debridement as below. -Dressed with Betadine wet-to-dry, DSD. -Continue off-loading with surgical shoe. -Doxycycline was dispensed for skin and soft tissue prophylaxis.  She is a high risk for amputation of the digit versus the foot versus the leg given that she is a diabetic with unknown A1c and the depth of the ulceration. -  Procedure: Excisional Debridement of Wound Tool: Sharp chisel blade/tissue nipper Rationale: Removal of non-viable soft tissue from the wound to promote healing.  Anesthesia: none Pre-Debridement Wound Measurements: 0.6 cm x 0.3 cm x 0.4 cm  Post-Debridement Wound Measurements: 0.6 cm x 0.5 cm x 0.4 cm  Type of Debridement: Sharp Excisional Tissue  Removed: Non-viable soft tissue Blood loss: Minimal (<50cc) Depth of Debridement: subcutaneous tissue. Technique: Sharp excisional debridement to bleeding, viable wound base.  Wound Progress: This my initial evaluation of continue monitor the progression of the wound Site healing conversation 7 Dressing: Dry, sterile, compression dressing. Disposition: Patient tolerated procedure well. Patient to return in 1 week for follow-up.  No follow-ups on file.

## 2020-10-03 ENCOUNTER — Encounter: Payer: Self-pay | Admitting: Podiatry

## 2020-10-10 ENCOUNTER — Encounter: Payer: Self-pay | Admitting: Podiatry

## 2020-10-10 ENCOUNTER — Ambulatory Visit: Payer: Medicare Other | Admitting: Podiatry

## 2020-10-10 ENCOUNTER — Other Ambulatory Visit: Payer: Self-pay

## 2020-10-10 DIAGNOSIS — E0843 Diabetes mellitus due to underlying condition with diabetic autonomic (poly)neuropathy: Secondary | ICD-10-CM

## 2020-10-10 DIAGNOSIS — L97512 Non-pressure chronic ulcer of other part of right foot with fat layer exposed: Secondary | ICD-10-CM

## 2020-10-10 NOTE — Progress Notes (Signed)
Subjective:  Patient ID: Debbie Wiggins, female    DOB: Oct 02, 1950,  MRN: [id number]  Chief Complaint  Patient presents with   Wound Check    "I'm here to see if my toe has improved."    70 y.o. female presents for wound care.  Patient presents with right third digit ulceration.  She states that it is doing a lot better she has been doing Betadine wet-to-dry dressing changes.  She is a diabetic.  She denies any other acute issues.  Review of Systems: Negative except as noted in the HPI. Denies N/V/F/Ch.  Past Medical History:  Diagnosis Date   Diabetes mellitus without complication ([place])     Current Outpatient Medications:    allopurinol (ZYLOPRIM) 100 MG tablet, Take 100 mg by mouth daily., Disp: , Rfl:    aspirin 81 MG EC tablet, Take by mouth., Disp: , Rfl:    BD INSULIN SYRINGE U/F 31G X 5/16" 0.3 ML MISC, 2 (two) times daily., Disp: , Rfl:    Blood Glucose Monitoring Suppl (FIFTY50 GLUCOSE METER 2.0) w/Device KIT, Use as directed (check blood glucose 3 times daily.), Disp: , Rfl:    Blood Pressure Monitoring (BLOOD PRESSURE CUFF) MISC, by Does not apply route., Disp: , Rfl:    carvedilol (COREG) 6.25 MG tablet, Take 6.25 mg by mouth 2 (two) times daily with a meal., Disp: , Rfl:    colchicine 0.6 MG tablet, Take 0.6 mg by mouth daily., Disp: , Rfl:    doxycycline (VIBRA-TABS) 100 MG tablet, Take 1 tablet (100 mg total) by mouth 2 (two) times daily for 14 days., Disp: 28 tablet, Rfl: 0   DULoxetine (CYMBALTA) 30 MG capsule, , Disp: , Rfl:    ergocalciferol (VITAMIN D2) 1.25 MG (50000 UT) capsule, Take by mouth., Disp: , Rfl:    HUMULIN 70/30 (70-30) 100 UNIT/ML injection, SMARTSIG:34 Unit(s) SUB-Q Twice Daily, Disp: , Rfl:    hydrochlorothiazide (HYDRODIURIL) 25 MG tablet, Take 25 mg by mouth daily., Disp: , Rfl: 10   Insulin Pen Needle (EXEL COMFORT POINT PEN NEEDLE) 29G X 12MM MISC, Use as directed, Disp: , Rfl:    Lancets (ONETOUCH DELICA PLUS [id number]) MISC, USE  TID UTD, Disp: ,  Rfl:    latanoprost (XALATAN) 0.005 % ophthalmic solution, , Disp: , Rfl:    latanoprost (XALATAN) 0.005 % ophthalmic solution, Apply to eye., Disp: , Rfl:    loratadine (CLARITIN) 10 MG tablet, Take by mouth., Disp: , Rfl:    losartan (COZAAR) 25 MG tablet, Take by mouth., Disp: , Rfl:    losartan (COZAAR) 50 MG tablet, Take 50 mg by mouth daily., Disp: , Rfl:    ONETOUCH VERIO test strip, USE TID UTD, Disp: , Rfl:    pioglitazone (ACTOS) 15 MG tablet, Take by mouth., Disp: , Rfl:    rosuvastatin (CRESTOR) 10 MG tablet, Take 10 mg by mouth daily., Disp: , Rfl:    sodium bicarbonate 650 MG tablet, TK 1 T PO BID, Disp: , Rfl:    sodium bicarbonate 650 MG tablet, Take by mouth., Disp: , Rfl:    SODIUM BICARBONATE PO, Take by mouth., Disp: , Rfl:    timolol (TIMOPTIC) 0.5 % ophthalmic solution, 1 drop every morning., Disp: , Rfl:    traZODone (DESYREL) 50 MG tablet, Take 50 mg by mouth at bedtime., Disp: , Rfl:    UNABLE TO FIND, Apply to eye., Disp: , Rfl:    valACYclovir (VALTREX) 1000 MG tablet, Take 1,000 mg by mouth 2 (  two) times daily., Disp: , Rfl:   Social History   Tobacco Use  Smoking Status Never  Smokeless Tobacco Never    Allergies  Allergen Reactions   Ceftin [Cefuroxime Axetil] Swelling and Rash   Cefuroxime Other (See Comments), Rash and Swelling   Latex Swelling, Rash and Other (See Comments)   Nickel Rash   Penicillins Swelling, Rash and Other (See Comments)   Sulfa Antibiotics Swelling and Rash   Objective:  There were no vitals filed for this visit. There is no height or weight on file to calculate BMI. Constitutional Well developed. Well nourished.  Vascular Dorsalis pedis pulses faintly palpable bilaterally. Posterior tibial pulses  faintly palpable bilaterally. Capillary refill normal to all digits.  No cyanosis or clubbing noted. Pedal hair growth normal.  Neurologic Normal speech. Oriented to person, place, and time. Protective sensation absent   Dermatologic Right third digit ulceration completely epithelialized.  No signs of recurrence noted.  No clinical signs of infection noted.  Orthopedic: No pain to palpation either foot.   Radiographs: None Assessment:   1. Diabetes mellitus due to underlying condition with diabetic autonomic neuropathy, unspecified whether long term insulin use ([place])   2. Skin ulcer of third toe of right foot with fat layer exposed ([place])     Plan:  Patient was evaluated and treated and all questions answered.  Ulcer right third digit ulceration with fat layer exposed with underlying erythema -Clinically healed and very epithelialized.  At this time I discussed with her the importance of shoe gear modification and no debridement to take the pressure and not prevent the ulceration from coming back.  Patient agrees with plan.  If any foot and ankle issues arise in future I will asked her to come see me.  No follow-ups on file.

## 2020-10-16 DIAGNOSIS — E1165 Type 2 diabetes mellitus with hyperglycemia: Secondary | ICD-10-CM | POA: Diagnosis not present

## 2020-10-22 DIAGNOSIS — I10 Essential (primary) hypertension: Secondary | ICD-10-CM | POA: Diagnosis not present

## 2020-10-22 DIAGNOSIS — E119 Type 2 diabetes mellitus without complications: Secondary | ICD-10-CM | POA: Diagnosis not present

## 2020-10-22 DIAGNOSIS — B0229 Other postherpetic nervous system involvement: Secondary | ICD-10-CM | POA: Diagnosis not present

## 2020-10-23 ENCOUNTER — Ambulatory Visit: Payer: Medicare Other | Admitting: Podiatry

## 2020-11-06 DIAGNOSIS — N2581 Secondary hyperparathyroidism of renal origin: Secondary | ICD-10-CM | POA: Diagnosis not present

## 2020-11-06 DIAGNOSIS — E1122 Type 2 diabetes mellitus with diabetic chronic kidney disease: Secondary | ICD-10-CM | POA: Diagnosis not present

## 2020-11-06 DIAGNOSIS — N1832 Chronic kidney disease, stage 3b: Secondary | ICD-10-CM | POA: Diagnosis not present

## 2020-11-06 DIAGNOSIS — I129 Hypertensive chronic kidney disease with stage 1 through stage 4 chronic kidney disease, or unspecified chronic kidney disease: Secondary | ICD-10-CM | POA: Diagnosis not present

## 2020-12-10 DIAGNOSIS — L03114 Cellulitis of left upper limb: Secondary | ICD-10-CM | POA: Diagnosis not present

## 2020-12-24 DIAGNOSIS — J0100 Acute maxillary sinusitis, unspecified: Secondary | ICD-10-CM | POA: Diagnosis not present

## 2020-12-24 DIAGNOSIS — E11649 Type 2 diabetes mellitus with hypoglycemia without coma: Secondary | ICD-10-CM | POA: Diagnosis not present

## 2020-12-24 DIAGNOSIS — I10 Essential (primary) hypertension: Secondary | ICD-10-CM | POA: Diagnosis not present

## 2020-12-24 DIAGNOSIS — Z794 Long term (current) use of insulin: Secondary | ICD-10-CM | POA: Diagnosis not present

## 2021-01-09 ENCOUNTER — Ambulatory Visit: Payer: Medicare Other | Admitting: Podiatry

## 2021-01-16 ENCOUNTER — Ambulatory Visit: Payer: Medicare Other | Admitting: Podiatry

## 2021-01-21 DIAGNOSIS — E118 Type 2 diabetes mellitus with unspecified complications: Secondary | ICD-10-CM | POA: Diagnosis not present

## 2021-01-30 ENCOUNTER — Encounter: Payer: Self-pay | Admitting: Podiatry

## 2021-01-30 ENCOUNTER — Other Ambulatory Visit: Payer: Self-pay

## 2021-01-30 ENCOUNTER — Ambulatory Visit: Payer: Medicare Other | Admitting: Podiatry

## 2021-01-30 DIAGNOSIS — M79675 Pain in left toe(s): Secondary | ICD-10-CM | POA: Diagnosis not present

## 2021-01-30 DIAGNOSIS — E0843 Diabetes mellitus due to underlying condition with diabetic autonomic (poly)neuropathy: Secondary | ICD-10-CM

## 2021-01-30 DIAGNOSIS — M79674 Pain in right toe(s): Secondary | ICD-10-CM | POA: Diagnosis not present

## 2021-01-30 DIAGNOSIS — B351 Tinea unguium: Secondary | ICD-10-CM

## 2021-01-30 NOTE — Progress Notes (Signed)
Complaint:  Visit Type: Patient returns to my office for continued preventative foot care services. Complaint: Patient states" my nails have grown long and thick and become painful to walk and wear shoes"  The patient presents for preventative foot care services. No changes to ROS. Patient has not been seen for 9 months.   Podiatric Exam: Vascular: dorsalis pedis and posterior tibial pulses are palpable bilateral. Capillary return is immediate. Temperature gradient is WNL. Skin turgor WNL  Sensorium: Normal Semmes Weinstein monofilament test. Normal tactile sensation bilaterally. Nail Exam: Pt has thick disfigured discolored nails with subungual debris noted bilateral entire nail hallux through fifth toenails Ulcer Exam: There is no evidence of ulcer or pre-ulcerative changes or infection. Orthopedic Exam: Muscle tone and strength are WNL. No limitations in general ROM. No crepitus or effusions noted. Foot type and digits show no abnormalities. HAV  B/L. Skin: No Porokeratosis. No infection or ulcers.  Diagnosis:  Onychomycosis, , Pain in right toe, pain in left toes  Treatment & Plan Procedures and Treatment: Consent by patient was obtained for treatment procedures.   Debridement of mycotic and hypertrophic toenails, 1 through 5 bilateral and clearing of subungual debris. No ulceration, no infection noted.  Return Visit-Office Procedure: Patient instructed to return to the office for a follow up visit 3 months for continued evaluation and treatment.    Boneta Lucks D.P.M.

## 2021-02-06 DIAGNOSIS — N1831 Chronic kidney disease, stage 3a: Secondary | ICD-10-CM | POA: Diagnosis not present

## 2021-02-06 DIAGNOSIS — I129 Hypertensive chronic kidney disease with stage 1 through stage 4 chronic kidney disease, or unspecified chronic kidney disease: Secondary | ICD-10-CM | POA: Diagnosis not present

## 2021-02-06 DIAGNOSIS — E11649 Type 2 diabetes mellitus with hypoglycemia without coma: Secondary | ICD-10-CM | POA: Diagnosis not present

## 2021-02-06 DIAGNOSIS — E1122 Type 2 diabetes mellitus with diabetic chronic kidney disease: Secondary | ICD-10-CM | POA: Diagnosis not present

## 2021-05-08 ENCOUNTER — Ambulatory Visit: Payer: Medicare Other | Admitting: Podiatry

## 2021-05-21 ENCOUNTER — Ambulatory Visit
Admission: RE | Admit: 2021-05-21 | Discharge: 2021-05-21 | Disposition: A | Discharge status: Home / Self Care | Payer: Medicare Other | Source: Ambulatory Visit | Attending: Family Medicine | Admitting: Family Medicine

## 2021-05-21 ENCOUNTER — Other Ambulatory Visit: Payer: Self-pay | Admitting: Family Medicine

## 2021-05-21 ENCOUNTER — Other Ambulatory Visit
Admission: RE | Admit: 2021-05-21 | Discharge: 2021-05-21 | Disposition: A | Discharge status: Home / Self Care | Payer: Medicare Other | Source: Ambulatory Visit | Attending: Family Medicine | Admitting: Family Medicine

## 2021-05-21 DIAGNOSIS — M7989 Other specified soft tissue disorders: Secondary | ICD-10-CM | POA: Insufficient documentation

## 2021-05-21 DIAGNOSIS — M79605 Pain in left leg: Secondary | ICD-10-CM | POA: Insufficient documentation

## 2021-05-21 DIAGNOSIS — R600 Localized edema: Secondary | ICD-10-CM | POA: Diagnosis not present

## 2021-05-21 DIAGNOSIS — E119 Type 2 diabetes mellitus without complications: Secondary | ICD-10-CM | POA: Diagnosis not present

## 2021-05-21 LAB — D-DIMER, QUANTITATIVE: D-Dimer, Quant: 1.05 ug/mL-FEU — ABNORMAL HIGH (ref 0.00–0.50)

## 2021-05-26 ENCOUNTER — Ambulatory Visit: Payer: Medicare Other | Admitting: Podiatry

## 2021-05-26 ENCOUNTER — Encounter: Payer: Self-pay | Admitting: Podiatry

## 2021-05-26 DIAGNOSIS — M79674 Pain in right toe(s): Secondary | ICD-10-CM

## 2021-05-26 DIAGNOSIS — M79675 Pain in left toe(s): Secondary | ICD-10-CM | POA: Diagnosis not present

## 2021-05-26 DIAGNOSIS — N1832 Chronic kidney disease, stage 3b: Secondary | ICD-10-CM | POA: Diagnosis not present

## 2021-05-26 DIAGNOSIS — B351 Tinea unguium: Secondary | ICD-10-CM | POA: Diagnosis not present

## 2021-05-26 DIAGNOSIS — E0843 Diabetes mellitus due to underlying condition with diabetic autonomic (poly)neuropathy: Secondary | ICD-10-CM | POA: Diagnosis not present

## 2021-05-26 DIAGNOSIS — E1122 Type 2 diabetes mellitus with diabetic chronic kidney disease: Secondary | ICD-10-CM | POA: Diagnosis not present

## 2021-05-26 DIAGNOSIS — I10 Essential (primary) hypertension: Secondary | ICD-10-CM | POA: Diagnosis not present

## 2021-05-26 DIAGNOSIS — R600 Localized edema: Secondary | ICD-10-CM | POA: Diagnosis not present

## 2021-05-26 NOTE — Progress Notes (Signed)
This patient returns to my office for at risk foot care.  This patient requires this care by a professional since this patient will be at risk due to having diabetes  This patient is unable to cut nails herself since the patient cannot reach her nails.These nails are painful walking and wearing shoes.  This patient presents for at risk foot care today.  General Appearance  Alert, conversant and in no acute stress.  Vascular  Dorsalis pedis and posterior tibial  pulses are weakly  palpable  due to swelling. bilaterally.  Capillary return is within normal limits  bilaterally. Temperature is within normal limits  bilaterally.  Neurologic  Senn-Weinstein monofilament wire test within normal limits  bilaterally. Muscle power within normal limits bilaterally.  Nails Thick disfigured discolored nails with subungual debris  from hallux to fifth toes bilaterally. No evidence of bacterial infection or drainage bilaterally.  Orthopedic  No limitations of motion  feet .  No crepitus or effusions noted.  No bony pathology or digital deformities noted.  HAV  B/L.  Skin  normotropic skin with no porokeratosis noted bilaterally.  No signs of infections or ulcers noted.     Onychomycosis  Pain in right toes  Pain in left toes  Consent was obtained for treatment procedures.   Mechanical debridement of nails 1-5  bilaterally performed with a nail nipper.  Filed with dremel without incident.    Return office visit    3 months                 Told patient to return for periodic foot care and evaluation due to potential at risk complications.   Weston Fulco DPM   

## 2021-06-11 DIAGNOSIS — Z0001 Encounter for general adult medical examination with abnormal findings: Secondary | ICD-10-CM | POA: Diagnosis not present

## 2021-06-11 DIAGNOSIS — Z0000 Encounter for general adult medical examination without abnormal findings: Secondary | ICD-10-CM | POA: Diagnosis not present

## 2021-06-11 DIAGNOSIS — I83029 Varicose veins of left lower extremity with ulcer of unspecified site: Secondary | ICD-10-CM | POA: Diagnosis not present

## 2021-06-11 DIAGNOSIS — I10 Essential (primary) hypertension: Secondary | ICD-10-CM | POA: Diagnosis not present

## 2021-06-18 DIAGNOSIS — I83029 Varicose veins of left lower extremity with ulcer of unspecified site: Secondary | ICD-10-CM | POA: Diagnosis not present

## 2021-06-18 DIAGNOSIS — L97929 Non-pressure chronic ulcer of unspecified part of left lower leg with unspecified severity: Secondary | ICD-10-CM | POA: Diagnosis not present

## 2021-06-19 DIAGNOSIS — E1122 Type 2 diabetes mellitus with diabetic chronic kidney disease: Secondary | ICD-10-CM | POA: Diagnosis not present

## 2021-06-19 DIAGNOSIS — R600 Localized edema: Secondary | ICD-10-CM | POA: Diagnosis not present

## 2021-06-19 DIAGNOSIS — N1832 Chronic kidney disease, stage 3b: Secondary | ICD-10-CM | POA: Diagnosis not present

## 2021-06-19 DIAGNOSIS — I10 Essential (primary) hypertension: Secondary | ICD-10-CM | POA: Diagnosis not present

## 2021-07-03 ENCOUNTER — Encounter: Payer: Medicare Other | Attending: Physician Assistant | Admitting: Physician Assistant

## 2021-07-03 DIAGNOSIS — I129 Hypertensive chronic kidney disease with stage 1 through stage 4 chronic kidney disease, or unspecified chronic kidney disease: Secondary | ICD-10-CM | POA: Insufficient documentation

## 2021-07-03 DIAGNOSIS — N183 Chronic kidney disease, stage 3 unspecified: Secondary | ICD-10-CM | POA: Insufficient documentation

## 2021-07-03 DIAGNOSIS — I87332 Chronic venous hypertension (idiopathic) with ulcer and inflammation of left lower extremity: Secondary | ICD-10-CM | POA: Insufficient documentation

## 2021-07-03 DIAGNOSIS — I10 Essential (primary) hypertension: Secondary | ICD-10-CM | POA: Diagnosis not present

## 2021-07-03 DIAGNOSIS — L97822 Non-pressure chronic ulcer of other part of left lower leg with fat layer exposed: Secondary | ICD-10-CM | POA: Insufficient documentation

## 2021-07-03 DIAGNOSIS — L97812 Non-pressure chronic ulcer of other part of right lower leg with fat layer exposed: Secondary | ICD-10-CM | POA: Insufficient documentation

## 2021-07-03 DIAGNOSIS — E11622 Type 2 diabetes mellitus with other skin ulcer: Secondary | ICD-10-CM | POA: Diagnosis not present

## 2021-07-04 DIAGNOSIS — I872 Venous insufficiency (chronic) (peripheral): Secondary | ICD-10-CM | POA: Diagnosis not present

## 2021-07-04 DIAGNOSIS — L97822 Non-pressure chronic ulcer of other part of left lower leg with fat layer exposed: Secondary | ICD-10-CM | POA: Diagnosis not present

## 2021-07-04 DIAGNOSIS — I87331 Chronic venous hypertension (idiopathic) with ulcer and inflammation of right lower extremity: Secondary | ICD-10-CM | POA: Diagnosis not present

## 2021-07-07 DIAGNOSIS — E1122 Type 2 diabetes mellitus with diabetic chronic kidney disease: Secondary | ICD-10-CM | POA: Diagnosis not present

## 2021-07-07 DIAGNOSIS — R600 Localized edema: Secondary | ICD-10-CM | POA: Diagnosis not present

## 2021-07-07 DIAGNOSIS — I10 Essential (primary) hypertension: Secondary | ICD-10-CM | POA: Diagnosis not present

## 2021-07-07 DIAGNOSIS — N1832 Chronic kidney disease, stage 3b: Secondary | ICD-10-CM | POA: Diagnosis not present

## 2021-07-09 DIAGNOSIS — I87331 Chronic venous hypertension (idiopathic) with ulcer and inflammation of right lower extremity: Secondary | ICD-10-CM | POA: Diagnosis not present

## 2021-07-09 DIAGNOSIS — S81802A Unspecified open wound, left lower leg, initial encounter: Secondary | ICD-10-CM | POA: Diagnosis not present

## 2021-07-09 DIAGNOSIS — I872 Venous insufficiency (chronic) (peripheral): Secondary | ICD-10-CM | POA: Diagnosis not present

## 2021-07-09 DIAGNOSIS — L97822 Non-pressure chronic ulcer of other part of left lower leg with fat layer exposed: Secondary | ICD-10-CM | POA: Diagnosis not present

## 2021-07-09 NOTE — Progress Notes (Signed)
MACIAH, KOSTER ([id number]) Visit Report for 07/03/2021 Allergy List Details Patient Name: MARSHELL, COBLER Date of Service: 07/03/2021 8:45 AM Medical Record Number: [id number] Patient Account Number: [id number] Date of Birth/Sex: [birth year] (71 y.o. F) Treating RN: Carlene Coria Primary Care Deryl Ports: [place] Pink Other Clinician: Referring Sheniya Garciaperez: [place] Pink Treating Shayanne Gomm/Extender: Skipper Cliche in Treatment: 0 Allergies Active Allergies Neosporin (neo-bac-polym) Ceftin cefuroxime nickle Type: Allergen penicillin Sulfa (Sulfonamide Antibiotics) Allergy Notes Electronic Signature(s) Signed: 07/09/2021 2:40:31 PM By: Carlene Coria RN Entered By: Carlene Coria on 07/03/2021 09:04:23 [name] ([id number]) -------------------------------------------------------------------------------- Arrival Information Details Patient Name: [name] Date of Service: 07/03/2021 8:45 AM Medical Record Number: [id number] Patient Account Number: [id number] Date of Birth/Sex: January 02, 1951 (71 y.o. F) Treating RN: Carlene Coria Primary Care Lucy Boardman: [place] Pink Other Clinician: Referring Clarinda Obi: [place] Pink Treating Whitman Meinhardt/Extender: Skipper Cliche in Treatment: 0 Visit Information Patient Arrived: Ambulatory Arrival Time: 08:54 Accompanied By: self Transfer Assistance: None Patient Identification Verified: Yes Secondary Verification Process Completed: Yes Patient Requires Transmission-Based Precautions: No Patient Has Alerts: No Electronic Signature(s) Signed: 07/09/2021 2:40:31 PM By: Carlene Coria RN Entered By: Carlene Coria on 07/03/2021 08:59:43 [name] ([id number]) -------------------------------------------------------------------------------- Clinic Level of Care Assessment Details Patient Name: [name] Date of Service: 07/03/2021 8:45 AM Medical Record Number: [id number] Patient Account Number: [id number] Date of Birth/Sex: 12-13-1950 (71 y.o. F) Treating RN:  Carlene Coria Primary Care Tzivia Oneil: [place] Pink Other Clinician: Referring Corban Kistler: [place] Pink Treating Chay Mazzoni/Extender: Skipper Cliche in Treatment: 0 Clinic Level of Care Assessment Items TOOL 2 Quantity Score X - Use when only an EandM is performed on the INITIAL visit 1 0 ASSESSMENTS - Nursing Assessment / Reassessment X - General Physical Exam (combine w/ comprehensive assessment (listed just below) when performed on new 1 20 pt. evals) X- 1 25 Comprehensive Assessment (HX, ROS, Risk Assessments, Wounds Hx, etc.) ASSESSMENTS - Wound and Skin Assessment / Reassessment X - Simple Wound Assessment / Reassessment - one wound 1 5 []  - 0 Complex Wound Assessment / Reassessment - multiple wounds []  - 0 Dermatologic / Skin Assessment (not related to wound area) ASSESSMENTS - Ostomy and/or Continence Assessment and Care []  - Incontinence Assessment and Management 0 []  - 0 Ostomy Care Assessment and Management (repouching, etc.) PROCESS - Coordination of Care X - Simple Patient / Family Education for ongoing care 1 15 []  - 0 Complex (extensive) Patient / Family Education for ongoing care X- 1 10 Staff obtains Programmer, systems, Records, Test Results / Process Orders []  - 0 Staff telephones HHA, Nursing Homes / Clarify orders / etc []  - 0 Routine Transfer to another Facility (non-emergent condition) []  - 0 Routine Hospital Admission (non-emergent condition) []  - 0 New Admissions / Biomedical engineer / Ordering NPWT, Apligraf, etc. []  - 0 Emergency Hospital Admission (emergent condition) X- 1 10 Simple Discharge Coordination []  - 0 Complex (extensive) Discharge Coordination PROCESS - Special Needs []  - Pediatric / Minor Patient Management 0 []  - 0 Isolation Patient Management []  - 0 Hearing / Language / Visual special needs []  - 0 Assessment of Community assistance (transportation, D/C planning, etc.) []  - 0 Additional assistance / Altered mentation []  -  0 Support Surface(s) Assessment (bed, cushion, seat, etc.) INTERVENTIONS - Wound Cleansing / Measurement X - Wound Imaging (photographs - any number of wounds) 1 5 []  - 0 Wound Tracing (instead of photographs) X- 1 5 Simple Wound Measurement - one wound []  - 0 Complex Wound Measurement - multiple wounds Englander, Autumm ([id number]) X- 1 5 Simple  Wound Cleansing - one wound []  - 0 Complex Wound Cleansing - multiple wounds INTERVENTIONS - Wound Dressings X - Small Wound Dressing one or multiple wounds 1 10 []  - 0 Medium Wound Dressing one or multiple wounds []  - 0 Large Wound Dressing one or multiple wounds []  - 0 Application of Medications - injection INTERVENTIONS - Miscellaneous []  - External ear exam 0 []  - 0 Specimen Collection (cultures, biopsies, blood, body fluids, etc.) []  - 0 Specimen(s) / Culture(s) sent or taken to Lab for analysis []  - 0 Patient Transfer (multiple staff / Civil Service fast streamer / Similar devices) []  - 0 Simple Staple / Suture removal (25 or less) []  - 0 Complex Staple / Suture removal (26 or more) []  - 0 Hypo / Hyperglycemic Management (close monitor of Blood Glucose) X- 1 15 Ankle / Brachial Index (ABI) - do not check if billed separately Has the patient been seen at the hospital within the last three years: Yes Total Score: 125 Level Of Care: New/Established - Level 4 Electronic Signature(s) Signed: 07/09/2021 2:40:31 PM By: Carlene Coria RN Entered By: Carlene Coria on 07/03/2021 09:49:12 [name] ([id number]) -------------------------------------------------------------------------------- Encounter Discharge Information Details Patient Name: [name] Date of Service: 07/03/2021 8:45 AM Medical Record Number: [id number] Patient Account Number: [id number] Date of Birth/Sex: March 30, 1950 (71 y.o. F) Treating RN: Carlene Coria Primary Care Danyia Borunda: [place] Pink Other Clinician: Referring Janiel Crisostomo: [place] Pink Treating Britiany Silbernagel/Extender: Skipper Cliche in Treatment: 0 Encounter Discharge Information Items Discharge Condition: Stable Ambulatory Status: Ambulatory Discharge Destination: Home Transportation: Private Auto Accompanied By: self Schedule Follow-up Appointment: Yes Clinical Summary of Care: Electronic Signature(s) Signed: 07/03/2021 9:50:50 AM By: Carlene Coria RN Entered By: Carlene Coria on 07/03/2021 09:50:50 [name] ([id number]) -------------------------------------------------------------------------------- Lower Extremity Assessment Details Patient Name: [name] Date of Service: 07/03/2021 8:45 AM Medical Record Number: [id number] Patient Account Number: [id number] Date of Birth/Sex: [birth year] (71 y.o. F) Treating RN: Carlene Coria Primary Care Ason Heslin: [place] Pink Other Clinician: Referring Benjermin Korber: [place] Pink Treating Crosby Bevan/Extender: Skipper Cliche in Treatment: 0 Edema Assessment Assessed: [Left: No] [Right: No] Edema: [Left: Ye] [Right: s] Calf Left: Right: Point of Measurement: 33 cm From Medial Instep 41 cm Ankle Left: Right: Point of Measurement: 12 cm From Medial Instep 26 cm Knee To Floor Left: Right: From Medial Instep 44 cm Vascular Assessment Pulses: Dorsalis Pedis Palpable: [Left:Yes] Doppler Audible: [Left:Yes] Blood Pressure: Brachial: [Left:141] Ankle: [Left:Dorsalis Pedis: 160 1.13] Electronic Signature(s) Signed: 07/09/2021 2:40:31 PM By: Carlene Coria RN Entered By: Carlene Coria on 07/03/2021 09:18:27 [name] ([id number]) -------------------------------------------------------------------------------- Multi Wound Chart Details Patient Name: [name] Date of Service: 07/03/2021 8:45 AM Medical Record Number: [id number] Patient Account Number: [id number] Date of Birth/Sex: 24-Aug-1950 (71 y.o. F) Treating RN: Carlene Coria Primary Care Manson Luckadoo: [place] Pink Other Clinician: Referring Hennesy Sobalvarro: [place] Pink Treating Odis Turck/Extender: Skipper Cliche in Treatment: 0 Vital Signs Height(in): 64 Pulse(bpm): 12 Weight(lbs): 311 Blood Pressure(mmHg): 141/76 Body Mass Index(BMI): 53.4 Temperature(F): 97.6 Respiratory Rate(breaths/min): 18 Photos: [N/A:N/A] Wound Location: Left, Anterior Lower Leg N/A N/A Wounding Event: Gradually Appeared N/A N/A Primary Etiology: Diabetic Wound/Ulcer of the Lower N/A N/A Extremity Comorbid History: Type II Diabetes N/A N/A Date Acquired: 05/19/2021 N/A N/A Weeks of Treatment: 0 N/A N/A Wound Status: Open N/A N/A Wound Recurrence: No N/A N/A Measurements L x W x D (cm) 0.4x0.3x0.1 N/A N/A Area (cm) : 0.094 N/A N/A Volume (cm) : 0.009 N/A N/A Classification: Grade 2 N/A N/A Exudate Amount: Medium N/A N/A Exudate Type: Serosanguineous  N/A N/A Exudate Color: red, brown N/A N/A Granulation Amount: Medium (34-66%) N/A N/A Granulation Quality: Pink N/A N/A Necrotic Amount: Medium (34-66%) N/A N/A Exposed Structures: Fat Layer (Subcutaneous Tissue): N/A N/A Yes Fascia: No Tendon: No Muscle: No Joint: No Bone: No Epithelialization: None N/A N/A Treatment Notes Electronic Signature(s) Signed: 07/09/2021 2:40:31 PM By: Yevonne Pax RN Entered By: Yevonne Pax on 07/03/2021 09:30:47 Alexis Frock ([id number]) -------------------------------------------------------------------------------- Multi-Disciplinary Care Plan Details Patient Name: Alexis Frock Date of Service: 07/03/2021 8:45 AM Medical Record Number: [id number] Patient Account Number: [id number] Date of Birth/Sex: 06/25/1950 (71 y.o. F) Treating RN: Yevonne Pax Primary Care Akari Crysler: Jerl Mina Other Clinician: Referring Cameryn Schum: Jerl Mina Treating Serafino Burciaga/Extender: Rowan Blase in Treatment: 0 Active Inactive Wound/Skin Impairment Nursing Diagnoses: Knowledge deficit related to ulceration/compromised skin integrity Goals: Patient/caregiver will verbalize understanding of skin care regimen Date Initiated:  07/03/2021 Target Resolution Date: 08/02/2021 Goal Status: Active Ulcer/skin breakdown will have a volume reduction of 30% by week 4 Date Initiated: 07/03/2021 Target Resolution Date: 09/02/2021 Goal Status: Active Ulcer/skin breakdown will have a volume reduction of 50% by week 8 Date Initiated: 07/03/2021 Target Resolution Date: 10/03/2021 Goal Status: Active Ulcer/skin breakdown will have a volume reduction of 80% by week 12 Date Initiated: 07/03/2021 Target Resolution Date: 11/02/2021 Goal Status: Active Ulcer/skin breakdown will heal within 14 weeks Date Initiated: 07/03/2021 Target Resolution Date: 12/03/2021 Goal Status: Active Interventions: Assess patient/caregiver ability to obtain necessary supplies Assess patient/caregiver ability to perform ulcer/skin care regimen upon admission and as needed Assess ulceration(s) every visit Notes: Electronic Signature(s) Signed: 07/09/2021 2:40:31 PM By: Yevonne Pax RN Entered By: Yevonne Pax on 07/03/2021 09:30:29 Alexis Frock ([id number]) -------------------------------------------------------------------------------- Pain Assessment Details Patient Name: Alexis Frock Date of Service: 07/03/2021 8:45 AM Medical Record Number: [id number] Patient Account Number: [id number] Date of Birth/Sex: [birth year] (71 y.o. F) Treating RN: Yevonne Pax Primary Care Lisel Siegrist: Jerl Mina Other Clinician: Referring Peniel Hass: Jerl Mina Treating Karelly Dewalt/Extender: Rowan Blase in Treatment: 0 Active Problems Location of Pain Severity and Description of Pain Patient Has Paino No Site Locations Pain Management and Medication Current Pain Management: Electronic Signature(s) Signed: 07/09/2021 2:40:31 PM By: Yevonne Pax RN Entered By: Yevonne Pax on 07/03/2021 08:59:49 Alexis Frock ([id number]) -------------------------------------------------------------------------------- Patient/Caregiver Education Details Patient Name: Alexis Frock Date  of Service: 07/03/2021 8:45 AM Medical Record Number: [id number] Patient Account Number: [id number] Date of Birth/Gender: 06-Feb-1950 (71 y.o. F) Treating RN: Yevonne Pax Primary Care Physician: Jerl Mina Other Clinician: Referring Physician: Jerl Mina Treating Physician/Extender: Rowan Blase in Treatment: 0 Education Assessment Education Provided To: Patient Education Topics Provided [facility]: Methods: Explain/Verbal Responses: State content correctly Electronic Signature(s) Signed: 07/09/2021 2:40:31 PM By: Yevonne Pax RN Entered By: Yevonne Pax on 07/03/2021 09:49:33 Alexis Frock ([id number]) -------------------------------------------------------------------------------- Wound Assessment Details Patient Name: Alexis Frock Date of Service: 07/03/2021 8:45 AM Medical Record Number: [id number] Patient Account Number: [id number] Date of Birth/Sex: 02-21-1950 (71 y.o. F) Treating RN: Yevonne Pax Primary Care Duston Smolenski: Jerl Mina Other Clinician: Referring Shad Ledvina: Jerl Mina Treating Carliyah Cotterman/Extender: Rowan Blase in Treatment: 0 Wound Status Wound Number: 1 Primary Etiology: Diabetic Wound/Ulcer of the Lower Extremity Wound Location: Left, Anterior Lower Leg Wound Status: Open Wounding Event: Gradually Appeared Comorbid History: Type II Diabetes Date Acquired: 05/19/2021 Weeks Of Treatment: 0 Clustered Wound: No Photos Wound Measurements Length: (cm) 0.4 Width: (cm) 0.3 Depth: (cm) 0.1 Area: (cm) 0.094 Volume: (cm) 0.009 % Reduction in Area: % Reduction in Volume: Epithelialization: None Tunneling: No Undermining: No Wound  Description Classification: Grade 2 Exudate Amount: Medium Exudate Type: Serosanguineous Exudate Color: red, brown Foul Odor After Cleansing: No Slough/Fibrino Yes Wound Bed Granulation Amount: Medium (34-66%) Exposed Structure Granulation Quality: Pink Fascia Exposed: No Necrotic Amount:  Medium (34-66%) Fat Layer (Subcutaneous Tissue) Exposed: Yes Necrotic Quality: Adherent Slough Tendon Exposed: No Muscle Exposed: No Joint Exposed: No Bone Exposed: No Electronic Signature(s) Signed: 07/09/2021 2:40:31 PM By: Yevonne Pax RN Entered By: Yevonne Pax on 07/03/2021 09:17:44 Alexis Frock ([id number]) -------------------------------------------------------------------------------- Vitals Details Patient Name: Alexis Frock Date of Service: 07/03/2021 8:45 AM Medical Record Number: [id number] Patient Account Number: [id number] Date of Birth/Sex: 1950/05/22 (71 y.o. F) Treating RN: Yevonne Pax Primary Care Ellenie Salome: Jerl Mina Other Clinician: Referring Milena Liggett: Jerl Mina Treating Eldon Zietlow/Extender: Rowan Blase in Treatment: 0 Vital Signs Time Taken: 08:59 Temperature (F): 97.6 Height (in): 64 Pulse (bpm): 72 Source: Stated Respiratory Rate (breaths/min): 18 Weight (lbs): 311 Blood Pressure (mmHg): 141/76 Source: Stated Reference Range: 80 - 120 mg / dl Body Mass Index (BMI): 53.4 Electronic Signature(s) Signed: 07/09/2021 2:40:31 PM By: Yevonne Pax RN Entered By: Yevonne Pax on 07/03/2021 09:02:50

## 2021-07-09 NOTE — Progress Notes (Signed)
TANGIE, CRAWFORD ([id number]) Visit Report for 07/03/2021 Chief Complaint Document Details Patient Name: Debbie Wiggins, Debbie Wiggins Date of Service: 07/03/2021 8:45 AM Medical Record Number: [id number] Patient Account Number: [id number] Date of Birth/Sex: 02-24-1950 (71 y.o. F) Treating RN: Carlene Coria Primary Care Provider: [place] Pink Other Clinician: Referring Provider: [place] Pink Treating Provider/Extender: Skipper Cliche in Treatment: 0 Information Obtained from: Patient Chief Complaint Left LE Ulcer Electronic Signature(s) Signed: 07/03/2021 9:25:06 AM By: Worthy Keeler PA-C Entered By: Worthy Keeler on 07/03/2021 09:25:05 Debbie Wiggins ([id number]) -------------------------------------------------------------------------------- HPI Details Patient Name: Debbie Wiggins Date of Service: 07/03/2021 8:45 AM Medical Record Number: [id number] Patient Account Number: [id number] Date of Birth/Sex: May 22, 1950 (71 y.o. F) Treating RN: Carlene Coria Primary Care Provider: [place] Pink Other Clinician: Referring Provider: [place] Pink Treating Provider/Extender: Skipper Cliche in Treatment: 0 History of Present Illness HPI Description: 07-03-2021 upon evaluation today patient appears to be doing well with regard to her legs she does have swelling bilaterally and I do believe she would benefit from Velcro compression wraps she does have something that is kind of similar although its only covering a small amount of space. I really think she needs something little bit more complete this was something she had gotten from a friend or family member. Nonetheless it has helped heal up a lot of the wound she does have just 1 very tiny area remaining open at this point. Fortunately there is no signs of active infection at this time. Patient does have a history of diabetes mellitus type 2 with a hemoglobin A1c of 6.6 which was done on 05-21-2021. She also has a history of hypertension, chronic kidney disease stage  III, and chronic venous insufficiency. Electronic Signature(s) Signed: 07/03/2021 9:38:34 AM By: Worthy Keeler PA-C Entered By: Worthy Keeler on 07/03/2021 09:38:34 Debbie Wiggins ([id number]) -------------------------------------------------------------------------------- Physical Exam Details Patient Name: Debbie Wiggins Date of Service: 07/03/2021 8:45 AM Medical Record Number: [id number] Patient Account Number: [id number] Date of Birth/Sex: 1950/08/25 (71 y.o. F) Treating RN: Carlene Coria Primary Care Provider: [place] Pink Other Clinician: Referring Provider: [place] Pink Treating Provider/Extender: Skipper Cliche in Treatment: 0 Constitutional sitting or standing blood pressure is within target range for patient.. pulse regular and within target range for patient.Marland Kitchen respirations regular, non- labored and within target range for patient.Marland Kitchen temperature within target range for patient.. Obese and well-hydrated in no acute distress. Eyes conjunctiva clear no eyelid edema noted. pupils equal round and reactive to light and accommodation. Ears, Nose, Mouth, and Throat no gross abnormality of ear auricles or external auditory canals. normal hearing noted during conversation. mucus membranes moist. Respiratory normal breathing without difficulty. Cardiovascular 2+ dorsalis pedis/posterior tibialis pulses. no clubbing, cyanosis, significant edema, <3 sec cap refill. Musculoskeletal normal gait and posture. no significant deformity or arthritic changes, no loss or range of motion, no clubbing. Psychiatric this patient is able to make decisions and demonstrates good insight into disease process. Alert and Oriented x 3. pleasant and cooperative. Notes Upon inspection patient's wound bed actually showed signs of good granulation and epithelization at this point. Fortunately I do not see any evidence of active infection locally or systemically which is great news and overall I am extremely  pleased with where things stand currently. No fevers, chills, nausea, vomiting, or diarrhea. She seems to have excellent blood flow into her lower extremities. ABI was 1.13. Electronic Signature(s) Signed: 07/03/2021 9:39:03 AM By: Worthy Keeler PA-C Entered By: Worthy Keeler on 07/03/2021 09:39:03 Debbie Wiggins, Debbie Wiggins ([id number]) -------------------------------------------------------------------------------- Physician  Orders Details Patient Name: Debbie Wiggins, Debbie Wiggins Date of Service: 07/03/2021 8:45 AM Medical Record Number: [id number] Patient Account Number: [id number] Date of Birth/Sex: 1950-02-25 (71 y.o. F) Treating RN: Yevonne Pax Primary Care Provider: Jerl Mina Other Clinician: Referring Provider: Jerl Mina Treating Provider/Extender: Rowan Blase in Treatment: 0 Verbal / Phone Orders: No Diagnosis Coding ICD-10 Coding Code Description [phone] Chronic venous hypertension (idiopathic) with ulcer and inflammation of left lower extremity L97.822 Non-pressure chronic ulcer of other part of left lower leg with fat layer exposed E11.622 Type 2 diabetes mellitus with other skin ulcer I10 Essential (primary) hypertension N18.30 Chronic kidney disease, stage 3 unspecified Follow-up Appointments o Return Appointment in 2 weeks. Bathing/ Shower/ Hygiene o May shower; gently cleanse wound with antibacterial soap, rinse and pat dry prior to dressing wounds Edema Control - Lymphedema / Segmental Compressive Device / Other o Elevate, Exercise Daily and Avoid Standing for Long Periods of Time. o Elevate legs to the level of the heart and pump ankles as often as possible o Elevate leg(s) parallel to the floor when sitting. Wound Treatment Wound #1 - Lower Leg Wound Laterality: Left, Anterior Topical: Betadine 1 x Per Day/30 Days Discharge Instructions: Apply betadine as directed. Primary Dressing: Gauze 1 x Per Day/30 Days Discharge Instructions: As directed: dry, moistened with  saline or moistened with Dakins Solution Secured With: Medipore Tape - 23M Medipore H Soft Cloth Surgical Tape, 2x2 (in/yd) 1 x Per Day/30 Days Compression Wrap: tubi grip 1 x Per Day/30 Days Discharge Instructions: size D Compression Stockings: Circaid Juxta Lite Compression Wrap (DME) Left Leg Compression Amount: 20-30 mmHG Discharge Instructions: Apply Circaid Juxta Lite Compression Wrap as directed Electronic Signature(s) Signed: 07/09/2021 8:55:04 AM By: Lenda Kelp PA-C Signed: 07/09/2021 2:40:31 PM By: Yevonne Pax RN Previous Signature: 07/03/2021 4:11:01 PM Version By: Lenda Kelp PA-C Entered By: Yevonne Pax on 07/08/2021 15:04:28 Debbie Wiggins ([id number]) -------------------------------------------------------------------------------- Problem List Details Patient Name: Debbie Wiggins Date of Service: 07/03/2021 8:45 AM Medical Record Number: [id number] Patient Account Number: [id number] Date of Birth/Sex: April 27, 1950 (71 y.o. F) Treating RN: Yevonne Pax Primary Care Provider: Jerl Mina Other Clinician: Referring Provider: Jerl Mina Treating Provider/Extender: Rowan Blase in Treatment: 0 Active Problems ICD-10 Encounter Code Description Active Date MDM Diagnosis I87.332 Chronic venous hypertension (idiopathic) with ulcer and inflammation of 07/03/2021 No Yes left lower extremity L97.822 Non-pressure chronic ulcer of other part of left lower leg with fat layer 07/03/2021 No Yes exposed E11.622 Type 2 diabetes mellitus with other skin ulcer 07/03/2021 No Yes I10 Essential (primary) hypertension 07/03/2021 No Yes N18.30 Chronic kidney disease, stage 3 unspecified 07/03/2021 No Yes Inactive Problems Resolved Problems Electronic Signature(s) Signed: 07/03/2021 9:24:17 AM By: Lenda Kelp PA-C Entered By: Lenda Kelp on 07/03/2021 09:24:17 Debbie Wiggins ([id number]) -------------------------------------------------------------------------------- Progress Note  Details Patient Name: Debbie Wiggins Date of Service: 07/03/2021 8:45 AM Medical Record Number: [id number] Patient Account Number: [id number] Date of Birth/Sex: December 28, 1950 (71 y.o. F) Treating RN: Yevonne Pax Primary Care Provider: Jerl Mina Other Clinician: Referring Provider: Jerl Mina Treating Provider/Extender: Rowan Blase in Treatment: 0 Subjective Chief Complaint Information obtained from Patient Left LE Ulcer History of Present Illness (HPI) 07-03-2021 upon evaluation today patient appears to be doing well with regard to her legs she does have swelling bilaterally and I do believe she would benefit from Velcro compression wraps she does have something that is kind of similar although its only covering a small amount of space. I really think she needs something  little bit more complete this was something she had gotten from a friend or family member. Nonetheless it has helped heal up a lot of the wound she does have just 1 very tiny area remaining open at this point. Fortunately there is no signs of active infection at this time. Patient does have a history of diabetes mellitus type 2 with a hemoglobin A1c of 6.6 which was done on 05-21-2021. She also has a history of hypertension, chronic kidney disease stage III, and chronic venous insufficiency. Patient History Allergies Neosporin (neo-bac-polym), Ceftin, cefuroxime, nickle, penicillin, Sulfa (Sulfonamide Antibiotics) Social History Never smoker, Marital Status - Widowed, Alcohol Use - Never, Drug Use - No History, Caffeine Use - Daily. Medical History Endocrine Patient has history of Type II Diabetes Patient is treated with Oral Agents. Review of Systems (ROS) Constitutional Symptoms (General Health) Denies complaints or symptoms of Fatigue, Fever, Chills, Marked Weight Change. Eyes Complains or has symptoms of Glasses / Contacts. Genitourinary CKD3 Integumentary (Skin) Complains or has symptoms of  Wounds. Objective Constitutional sitting or standing blood pressure is within target range for patient.. pulse regular and within target range for patient.Marland Kitchen respirations regular, non- labored and within target range for patient.Marland Kitchen temperature within target range for patient.. Obese and well-hydrated in no acute distress. Vitals Time Taken: 8:59 AM, Height: 64 in, Source: Stated, Weight: 311 lbs, Source: Stated, BMI: 53.4, Temperature: 97.6 F, Pulse: 72 bpm, Respiratory Rate: 18 breaths/min, Blood Pressure: 141/76 mmHg. Eyes conjunctiva clear no eyelid edema noted. pupils equal round and reactive to light and accommodation. Ears, Nose, Mouth, and Throat no gross abnormality of ear auricles or external auditory canals. normal hearing noted during conversation. mucus membranes moist. Debbie Wiggins, Debbie Wiggins ([id number]) Respiratory normal breathing without difficulty. Cardiovascular 2+ dorsalis pedis/posterior tibialis pulses. no clubbing, cyanosis, significant edema, Musculoskeletal normal gait and posture. no significant deformity or arthritic changes, no loss or range of motion, no clubbing. Psychiatric this patient is able to make decisions and demonstrates good insight into disease process. Alert and Oriented x 3. pleasant and cooperative. General Notes: Upon inspection patient's wound bed actually showed signs of good granulation and epithelization at this point. Fortunately I do not see any evidence of active infection locally or systemically which is great news and overall I am extremely pleased with where things stand currently. No fevers, chills, nausea, vomiting, or diarrhea. She seems to have excellent blood flow into her lower extremities. ABI was 1.13. Integumentary (Hair, Skin) Wound #1 status is Open. Original cause of wound was Gradually Appeared. The date acquired was: 05/19/2021. The wound is located on the Left,Anterior Lower Leg. The wound measures 0.4cm length x 0.3cm width x 0.1cm  depth; 0.094cm^2 area and 0.009cm^3 volume. There is Fat Layer (Subcutaneous Tissue) exposed. There is no tunneling or undermining noted. There is a medium amount of serosanguineous drainage noted. There is medium (34-66%) pink granulation within the wound bed. There is a medium (34-66%) amount of necrotic tissue within the wound bed including Adherent Slough. Assessment Active Problems ICD-10 Chronic venous hypertension (idiopathic) with ulcer and inflammation of left lower extremity Non-pressure chronic ulcer of other part of left lower leg with fat layer exposed Type 2 diabetes mellitus with other skin ulcer Essential (primary) hypertension Chronic kidney disease, stage 3 unspecified Plan Follow-up Appointments: Return Appointment in 2 weeks. Bathing/ Shower/ Hygiene: May shower; gently cleanse wound with antibacterial soap, rinse and pat dry prior to dressing wounds Edema Control - Lymphedema / Segmental Compressive Device / Other: Elevate, Exercise Daily  and Avoid Standing for Long Periods of Time. Elevate legs to the level of the heart and pump ankles as often as possible Elevate leg(s) parallel to the floor when sitting. WOUND #1: - Lower Leg Wound Laterality: Left, Anterior Topical: Betadine 1 x Per Day/30 Days Discharge Instructions: Apply betadine as directed. Primary Dressing: Gauze 1 x Per Day/30 Days Discharge Instructions: As directed: dry, moistened with saline or moistened with Dakins Solution Secured With: Medipore Tape - 72M Medipore H Soft Cloth Surgical Tape, 2x2 (in/yd) 1 x Per Day/30 Days Compression Wrap: tubi grip 1 x Per Day/30 Days Discharge Instructions: size D 1. I would recommend currently that we going to continue with the wound care measures as before and the patient is in agreement with plan this includes the use of the Betadine which she has been utilizing this is done extremely well to keep things very dry and keep it from becoming infected. She is  using a [employer] dressing over top and there is just a very tiny area remaining open which is great news. 2. I am also can recommend that we have the patient continue with the paper tape to secure this in place. 3. I am also can recommend that she continue with Tubigrip size D over top and then she will subsequently use what she has for a Velcro compression for now until we see about getting her a new Velcro compression. She is going to try to get in for both legs and again I think the juxta lite would be ideal here. We will see patient back for reevaluation in 2 weeks here in the clinic. If anything worsens or changes patient will contact our office for additional recommendations. Debbie Wiggins, Debbie Wiggins ([id number]) Electronic Signature(s) Signed: 07/03/2021 9:40:08 AM By: Worthy Keeler PA-C Entered By: Worthy Keeler on 07/03/2021 09:40:08 Debbie Wiggins ([id number]) -------------------------------------------------------------------------------- ROS/PFSH Details Patient Name: Debbie Wiggins Date of Service: 07/03/2021 8:45 AM Medical Record Number: [id number] Patient Account Number: [id number] Date of Birth/Sex: [birth year] (71 y.o. F) Treating RN: Carlene Coria Primary Care Provider: [place] Pink Other Clinician: Referring Provider: [place] Pink Treating Provider/Extender: Skipper Cliche in Treatment: 0 Constitutional Symptoms (General Health) Complaints and Symptoms: Negative for: Fatigue; Fever; Chills; Marked Weight Change Eyes Complaints and Symptoms: Positive for: Glasses / Contacts Integumentary (Skin) Complaints and Symptoms: Positive for: Wounds Endocrine Medical History: Positive for: Type II Diabetes Time with diabetes: 84 Treated with: Oral agents Genitourinary Complaints and Symptoms: Review of System Notes: CKD3 Immunizations Pneumococcal Vaccine: Received Pneumococcal Vaccination: Yes Received Pneumococcal Vaccination On or After 60th Birthday: Yes Implantable  Devices Yes Family and Social History Never smoker; Marital Status - Widowed; Alcohol Use: Never; Drug Use: No History; Caffeine Use: Daily Electronic Signature(s) Signed: 07/03/2021 4:11:01 PM By: Worthy Keeler PA-C Signed: 07/09/2021 2:40:31 PM By: Carlene Coria RN Entered By: Carlene Coria on 07/03/2021 09:09:09 Debbie Wiggins ([id number]) -------------------------------------------------------------------------------- SuperBill Details Patient Name: Debbie Wiggins Date of Service: 07/03/2021 Medical Record Number: [id number] Patient Account Number: [id number] Date of Birth/Sex: 1950/05/30 (71 y.o. F) Treating RN: Carlene Coria Primary Care Provider: [place] Pink Other Clinician: Referring Provider: [place] Pink Treating Provider/Extender: Skipper Cliche in Treatment: 0 Diagnosis Coding ICD-10 Codes Code Description [id number] Chronic venous hypertension (idiopathic) with ulcer and inflammation of left lower extremity L97.822 Non-pressure chronic ulcer of other part of left lower leg with fat layer exposed E11.622 Type 2 diabetes mellitus with other skin ulcer I10 Essential (primary) hypertension N18.30 Chronic kidney disease, stage 3 unspecified Facility Procedures  CPT4 Code: [id number] Description: [id number] - WOUND CARE VISIT-LEV 4 EST PT Modifier: Quantity: 1 Physician Procedures CPT4 Code Description: [id number] WC PHYS LEVEL 3 o NEW PT Modifier: Quantity: 1 CPT4 Code Description: ICD-10 Diagnosis Description I87.332 Chronic venous hypertension (idiopathic) with ulcer and inflammation L97.822 Non-pressure chronic ulcer of other part of left lower leg with fat l E11.622 Type 2 diabetes mellitus with other  skin ulcer I10 Essential (primary) hypertension Modifier: of left lower extremity ayer exposed Quantity: Electronic Signature(s) Signed: 07/03/2021 9:49:24 AM By: Carlene Coria RN Signed: 07/03/2021 4:11:01 PM By: Worthy Keeler PA-C Previous Signature: 07/03/2021 9:40:24 AM Version  By: Worthy Keeler PA-C Entered By: Carlene Coria on 07/03/2021 09:49:23

## 2021-07-09 NOTE — Progress Notes (Signed)
Debbie Wiggins, Debbie Wiggins ([id number]) Visit Report for 07/03/2021 Abuse Risk Screen Details Patient Name: Debbie Wiggins, Debbie Wiggins Date of Service: 07/03/2021 8:45 AM Medical Record Number: [id number] Patient Account Number: [id number] Date of Birth/Sex: 1950/08/30 (71 y.o. F) Treating RN: Yevonne Pax Primary Care Verdie Barrows: Jerl Mina Other Clinician: Referring Tomi Grandpre: Jerl Mina Treating Gae Bihl/Extender: Rowan Blase in Treatment: 0 Abuse Risk Screen Items Answer ABUSE RISK SCREEN: Has anyone close to you tried to hurt or harm you recentlyo No Do you feel uncomfortable with anyone in your familyo No Has anyone forced you do things that you didnot want to doo No Electronic Signature(s) Signed: 07/09/2021 2:40:31 PM By: Yevonne Pax RN Entered By: Yevonne Pax on 07/03/2021 09:09:21 Debbie Wiggins ([id number]) -------------------------------------------------------------------------------- Activities of Daily Living Details Patient Name: Debbie Wiggins, Debbie Wiggins Date of Service: 07/03/2021 8:45 AM Medical Record Number: [id number] Patient Account Number: [id number] Date of Birth/Sex: [birth year] (71 y.o. F) Treating RN: Yevonne Pax Primary Care Elfida Shimada: Jerl Mina Other Clinician: Referring Nithila Sumners: Jerl Mina Treating Yuval Rubens/Extender: Rowan Blase in Treatment: 0 Activities of Daily Living Items Answer Activities of Daily Living (Please select one for each item) Drive Automobile Completely Able Take Medications Completely Able Use Telephone Completely Able Care for Appearance Completely Able Use Toilet Completely Able Bath / Shower Completely Able Dress Self Completely Able Feed Self Completely Able Walk Completely Able Get In / Out Bed Completely Able Housework Completely Able Prepare Meals Completely Able Handle Money Completely Able Shop for Self Completely Able Electronic Signature(s) Signed: 07/09/2021 2:40:31 PM By: Yevonne Pax RN Entered By: Yevonne Pax on 07/03/2021  09:09:45 Debbie Wiggins ([id number]) -------------------------------------------------------------------------------- Education Screening Details Patient Name: Debbie Wiggins Date of Service: 07/03/2021 8:45 AM Medical Record Number: [id number] Patient Account Number: [id number] Date of Birth/Sex: 02/26/1950 (71 y.o. F) Treating RN: Yevonne Pax Primary Care Jeyda Siebel: Jerl Mina Other Clinician: Referring Latish Toutant: Jerl Mina Treating Ticara Waner/Extender: Rowan Blase in Treatment: 0 Primary Learner Assessed: Patient Learning Preferences/Education Level/Primary Language Learning Preference: Explanation Highest Education Level: College or Above Preferred Language: English Cognitive Barrier Language Barrier: No Translator Needed: No Memory Deficit: No Emotional Barrier: No Cultural/Religious Beliefs Affecting Medical Care: No Physical Barrier Impaired Vision: Yes Glasses Impaired Hearing: No Decreased Hand dexterity: No Knowledge/Comprehension Knowledge Level: Medium Comprehension Level: High Ability to understand written instructions: High Ability to understand verbal instructions: High Motivation Anxiety Level: Anxious Cooperation: Cooperative Education Importance: Acknowledges Need Interest in Health Problems: Asks Questions Perception: Coherent Willingness to Engage in Self-Management High Activities: Readiness to Engage in Self-Management High Activities: Electronic Signature(s) Signed: 07/09/2021 2:40:31 PM By: Yevonne Pax RN Entered By: Yevonne Pax on 07/03/2021 09:10:13 Debbie Wiggins ([id number]) -------------------------------------------------------------------------------- Fall Risk Assessment Details Patient Name: Debbie Wiggins Date of Service: 07/03/2021 8:45 AM Medical Record Number: [id number] Patient Account Number: [id number] Date of Birth/Sex: May 13, 1950 (71 y.o. F) Treating RN: Yevonne Pax Primary Care Oona Trammel: Jerl Mina Other  Clinician: Referring Velisa Regnier: Jerl Mina Treating Irasema Chalk/Extender: Rowan Blase in Treatment: 0 Fall Risk Assessment Items Have you had 2 or more falls in the last 12 monthso 0 No Have you had any fall that resulted in injury in the last 12 monthso 0 No FALLS RISK SCREEN History of falling - immediate or within 3 months 0 No Secondary diagnosis (Do you have 2 or more medical diagnoseso) 0 No Ambulatory aid None/bed rest/wheelchair/nurse 0 No Crutches/cane/walker 0 No Furniture 0 No Intravenous therapy Access/Saline/Heparin Lock 0 No Gait/Transferring Normal/ bed rest/ wheelchair 0 No Weak (short steps with or without shuffle, stooped but able to  lift head while walking, may 0 No seek support from furniture) Impaired (short steps with shuffle, may have difficulty arising from chair, head down, impaired 0 No balance) Mental Status Oriented to own ability 0 No Electronic Signature(s) Signed: 07/09/2021 2:40:31 PM By: Yevonne Pax RN Entered By: Yevonne Pax on 07/03/2021 09:10:20 Debbie Wiggins ([id number]) -------------------------------------------------------------------------------- Foot Assessment Details Patient Name: Debbie Wiggins Date of Service: 07/03/2021 8:45 AM Medical Record Number: [id number] Patient Account Number: [id number] Date of Birth/Sex: 05-14-1950 (71 y.o. F) Treating RN: Yevonne Pax Primary Care Geovanie Winnett: Jerl Mina Other Clinician: Referring Justus Droke: Jerl Mina Treating Alexei Ey/Extender: Rowan Blase in Treatment: 0 Foot Assessment Items Site Locations + = Sensation present, - = Sensation absent, C = Callus, U = Ulcer R = Redness, W = Warmth, M = Maceration, PU = Pre-ulcerative lesion F = Fissure, S = Swelling, D = Dryness Assessment Right: Left: Other Deformity: No No Prior Foot Ulcer: No No Prior Amputation: No No Charcot Joint: No No Ambulatory Status: Ambulatory Without Help Gait: Steady Electronic Signature(s) Signed:  07/09/2021 2:40:31 PM By: Yevonne Pax RN Entered By: Yevonne Pax on 07/03/2021 09:16:33 Debbie Wiggins ([id number]) -------------------------------------------------------------------------------- Nutrition Risk Screening Details Patient Name: Debbie Wiggins Date of Service: 07/03/2021 8:45 AM Medical Record Number: [id number] Patient Account Number: [id number] Date of Birth/Sex: 1950-04-16 (71 y.o. F) Treating RN: Yevonne Pax Primary Care Jawanna Dykman: Jerl Mina Other Clinician: Referring Philisha Weinel: Jerl Mina Treating Reene Harlacher/Extender: Rowan Blase in Treatment: 0 Height (in): 64 Weight (lbs): 311 Body Mass Index (BMI): 53.4 Nutrition Risk Screening Items Score Screening NUTRITION RISK SCREEN: I have an illness or condition that made me change the kind and/or amount of food I eat 0 No I eat fewer than two meals per day 0 No I eat few fruits and vegetables, or milk products 0 No I have three or more drinks of beer, liquor or wine almost every day 0 No I have tooth or mouth problems that make it hard for me to eat 0 No I don't always have enough money to buy the food I need 0 No I eat alone most of the time 0 No I take three or more different prescribed or over-the-counter drugs a day 1 Yes Without wanting to, I have lost or gained 10 pounds in the last six months 0 No I am not always physically able to shop, cook and/or feed myself 0 No Nutrition Protocols Good Risk Protocol 0 No interventions needed Moderate Risk Protocol High Risk Proctocol Risk Level: Good Risk Score: 1 Electronic Signature(s) Signed: 07/09/2021 2:40:31 PM By: Yevonne Pax RN Entered By: Yevonne Pax on 07/03/2021 09:10:30

## 2021-07-17 ENCOUNTER — Encounter: Payer: Medicare Other | Admitting: Physician Assistant

## 2021-07-17 DIAGNOSIS — L97212 Non-pressure chronic ulcer of right calf with fat layer exposed: Secondary | ICD-10-CM | POA: Diagnosis not present

## 2021-07-17 DIAGNOSIS — N183 Chronic kidney disease, stage 3 unspecified: Secondary | ICD-10-CM | POA: Diagnosis not present

## 2021-07-17 DIAGNOSIS — I129 Hypertensive chronic kidney disease with stage 1 through stage 4 chronic kidney disease, or unspecified chronic kidney disease: Secondary | ICD-10-CM | POA: Diagnosis not present

## 2021-07-17 DIAGNOSIS — I87332 Chronic venous hypertension (idiopathic) with ulcer and inflammation of left lower extremity: Secondary | ICD-10-CM | POA: Diagnosis not present

## 2021-07-17 DIAGNOSIS — L97812 Non-pressure chronic ulcer of other part of right lower leg with fat layer exposed: Secondary | ICD-10-CM | POA: Diagnosis not present

## 2021-07-17 DIAGNOSIS — E11622 Type 2 diabetes mellitus with other skin ulcer: Secondary | ICD-10-CM | POA: Diagnosis not present

## 2021-07-17 DIAGNOSIS — L97822 Non-pressure chronic ulcer of other part of left lower leg with fat layer exposed: Secondary | ICD-10-CM | POA: Diagnosis not present

## 2021-07-17 NOTE — Progress Notes (Addendum)
Debbie Wiggins ([id number]) Visit Report for 07/17/2021 Chief Complaint Document Details Patient Name: Debbie Wiggins, Debbie Wiggins Date of Service: 07/17/2021 10:15 AM Medical Record Number: [id number] Patient Account Number: [id number] Date of Birth/Sex: [birth year] (71 y.o. F) Treating RN: Carlene Coria Primary Care Provider: [place] Pink Other Clinician: Referring Provider: [place] Pink Treating Provider/Extender: Skipper Cliche in Treatment: 2 Information Obtained from: Patient Chief Complaint Bilateral LE Ulcers Electronic Signature(s) Signed: 07/18/2021 8:25:20 AM By: Worthy Keeler PA-C Previous Signature: 07/17/2021 10:39:38 AM Version By: Worthy Keeler PA-C Entered By: Worthy Keeler on 07/18/2021 08:25:20 Debbie Wiggins ([id number]) -------------------------------------------------------------------------------- HPI Details Patient Name: Debbie Wiggins Date of Service: 07/17/2021 10:15 AM Medical Record Number: [id number] Patient Account Number: [id number] Date of Birth/Sex: Jul 12, 1950 (71 y.o. F) Treating RN: Carlene Coria Primary Care Provider: [place] Pink Other Clinician: Referring Provider: [place] Pink Treating Provider/Extender: Skipper Cliche in Treatment: 2 History of Present Illness HPI Description: 07-03-2021 upon evaluation today patient appears to be doing well with regard to her legs she does have swelling bilaterally and I do believe she would benefit from Velcro compression wraps she does have something that is kind of similar although its only covering a small amount of space. I really think she needs something little bit more complete this was something she had gotten from a friend or family member. Nonetheless it has helped heal up a lot of the wound she does have just 1 very tiny area remaining open at this point. Fortunately there is no signs of active infection at this time. Patient does have a history of diabetes mellitus type 2 with a hemoglobin A1c of 6.6 which was  done on 05-21-2021. She also has a history of hypertension, chronic kidney disease stage III, and chronic venous insufficiency. 07-17-2021 upon evaluation today patient appears to be doing pretty well currently in regard to the wound on the right posterior lower extremity. Fortunately there does not appear to be any evidence of active infection locally or systemically at this time which is great news and overall the wound is doing well. In fact the wound that we were initially evaluating last week appears to be healed the new wound is again significantly better. I do not think that there is any major issue here and hopefully will get to see things improve appropriately. We will likely be continuing with the same measures as far as wound care is concerned at this point. The patient is in agreement with that plan. Electronic Signature(s) Signed: 07/18/2021 8:23:37 AM By: Worthy Keeler PA-C Entered By: Worthy Keeler on 07/18/2021 08:23:36 Debbie Wiggins ([id number]) -------------------------------------------------------------------------------- Physical Exam Details Patient Name: Debbie Wiggins Date of Service: 07/17/2021 10:15 AM Medical Record Number: [id number] Patient Account Number: [id number] Date of Birth/Sex: [birth year] (71 y.o. F) Treating RN: Carlene Coria Primary Care Provider: [place] Pink Other Clinician: Referring Provider: [place] Pink Treating Provider/Extender: Skipper Cliche in Treatment: 2 Constitutional Well-nourished and well-hydrated in no acute distress. Respiratory normal breathing without difficulty. Psychiatric this patient is able to make decisions and demonstrates good insight into disease process. Alert and Oriented x 3. pleasant and cooperative. Notes Upon evaluation patient's wound bed actually showed signs of being healed at 1 area though she does have a small area and a second spot which we are going to address here at this point. Fortunately I do not see any  evidence of active infection which is good news. This new area is actually on the opposite leg and is due to a small blister  that popped up nonetheless I am hopeful that we will be able to get this under control fairly rapidly. The good news is she did get her Velcro compression wraps. She also was placed on fluid pills by her primary care provider that they are just having her take this 3 times per week again the concern is for her kidneys she apparently only has 1 functioning kidney. Electronic Signature(s) Signed: 07/18/2021 8:25:33 AM By: Worthy Keeler PA-C Entered By: Worthy Keeler on 07/18/2021 08:25:33 Debbie Wiggins ([id number]) -------------------------------------------------------------------------------- Physician Orders Details Patient Name: Debbie Wiggins Date of Service: 07/17/2021 10:15 AM Medical Record Number: [id number] Patient Account Number: [id number] Date of Birth/Sex: 06/10/1950 (71 y.o. F) Treating RN: Carlene Coria Primary Care Provider: [place] Pink Other Clinician: Referring Provider: [place] Pink Treating Provider/Extender: Skipper Cliche in Treatment: 2 Verbal / Phone Orders: No Diagnosis Coding ICD-10 Coding Code Description [phone] Chronic venous hypertension (idiopathic) with ulcer and inflammation of left lower extremity L97.822 Non-pressure chronic ulcer of other part of left lower leg with fat layer exposed E11.622 Type 2 diabetes mellitus with other skin ulcer I10 Essential (primary) hypertension N18.30 Chronic kidney disease, stage 3 unspecified Follow-up Appointments o Return Appointment in 2 weeks. Bathing/ Shower/ Hygiene o May shower; gently cleanse wound with antibacterial soap, rinse and pat dry prior to dressing wounds Edema Control - Lymphedema / Segmental Compressive Device / Other o Elevate, Exercise Daily and Avoid Standing for Long Periods of Time. o Elevate legs to the level of the heart and pump ankles as often as  possible o Elevate leg(s) parallel to the floor when sitting. Wound Treatment Wound #2 - Lower Leg Wound Laterality: Right, Posterior Cleanser: Byram Ancillary Kit - 15 Day Supply (Generic) 3 x Per Week/30 Days Discharge Instructions: Use supplies as instructed; Kit contains: (15) Saline Bullets; (15) 3x3 Gauze; 15 pr Gloves Cleanser: Soap and Water 3 x Per Week/30 Days Discharge Instructions: Gently cleanse wound with antibacterial soap, rinse and pat dry prior to dressing wounds Primary Dressing: Silvercel 4 1/4x 4 1/4 (in/in) 3 x Per Week/30 Days Discharge Instructions: Apply Silvercel 4 1/4x 4 1/4 (in/in) as instructed Secondary Dressing: ABD Pad 5x9 (in/in) 3 x Per Week/30 Days Discharge Instructions: Cover with ABD pad Electronic Signature(s) Signed: 07/18/2021 10:10:35 AM By: Carlene Coria RN Signed: 07/18/2021 4:59:17 PM By: Worthy Keeler PA-C Entered By: Carlene Coria on 07/17/2021 11:01:59 Debbie Wiggins ([id number]) -------------------------------------------------------------------------------- Problem List Details Patient Name: Debbie Wiggins Date of Service: 07/17/2021 10:15 AM Medical Record Number: [id number] Patient Account Number: [id number] Date of Birth/Sex: 1950/04/22 (71 y.o. F) Treating RN: Carlene Coria Primary Care Provider: [place] Pink Other Clinician: Referring Provider: [place] Pink Treating Provider/Extender: Skipper Cliche in Treatment: 2 Active Problems ICD-10 Encounter Code Description Active Date MDM Diagnosis I87.332 Chronic venous hypertension (idiopathic) with ulcer and inflammation of 07/03/2021 No Yes left lower extremity L97.822 Non-pressure chronic ulcer of other part of left lower leg with fat layer 07/03/2021 No Yes exposed L97.812 Non-pressure chronic ulcer of other part of right lower leg with fat layer 07/17/2021 No Yes exposed E11.622 Type 2 diabetes mellitus with other skin ulcer 07/03/2021 No Yes I10 Essential (primary) hypertension  07/03/2021 No Yes N18.30 Chronic kidney disease, stage 3 unspecified 07/03/2021 No Yes Inactive Problems Resolved Problems Electronic Signature(s) Signed: 07/18/2021 8:25:07 AM By: Worthy Keeler PA-C Previous Signature: 07/17/2021 10:39:35 AM Version By: Worthy Keeler PA-C Entered By: Worthy Keeler on 07/18/2021 08:25:07 Opie, Wanda Plump ([id number]) -------------------------------------------------------------------------------- Progress Note Details Patient  Name: THEO, REITHER Date of Service: 07/17/2021 10:15 AM Medical Record Number: [id number] Patient Account Number: [id number] Date of Birth/Sex: August 07, 1950 (71 y.o. F) Treating RN: Carlene Coria Primary Care Provider: [place] Pink Other Clinician: Referring Provider: [place] Pink Treating Provider/Extender: Skipper Cliche in Treatment: 2 Subjective Chief Complaint Information obtained from Patient Bilateral LE Ulcers History of Present Illness (HPI) 07-03-2021 upon evaluation today patient appears to be doing well with regard to her legs she does have swelling bilaterally and I do believe she would benefit from Velcro compression wraps she does have something that is kind of similar although its only covering a small amount of space. I really think she needs something little bit more complete this was something she had gotten from a friend or family member. Nonetheless it has helped heal up a lot of the wound she does have just 1 very tiny area remaining open at this point. Fortunately there is no signs of active infection at this time. Patient does have a history of diabetes mellitus type 2 with a hemoglobin A1c of 6.6 which was done on 05-21-2021. She also has a history of hypertension, chronic kidney disease stage III, and chronic venous insufficiency. 07-17-2021 upon evaluation today patient appears to be doing pretty well currently in regard to the wound on the right posterior lower extremity. Fortunately there does not appear to  be any evidence of active infection locally or systemically at this time which is great news and overall the wound is doing well. In fact the wound that we were initially evaluating last week appears to be healed the new wound is again significantly better. I do not think that there is any major issue here and hopefully will get to see things improve appropriately. We will likely be continuing with the same measures as far as wound care is concerned at this point. The patient is in agreement with that plan. Objective Constitutional Well-nourished and well-hydrated in no acute distress. Vitals Time Taken: 10:25 AM, Height: 64 in, Weight: 311 lbs, BMI: 53.4, Temperature: 97.6 F, Pulse: 72 bpm, Respiratory Rate: 18 breaths/min, Blood Pressure: 141/76 mmHg. Respiratory normal breathing without difficulty. Psychiatric this patient is able to make decisions and demonstrates good insight into disease process. Alert and Oriented x 3. pleasant and cooperative. General Notes: Upon evaluation patient's wound bed actually showed signs of being healed at 1 area though she does have a small area and a second spot which we are going to address here at this point. Fortunately I do not see any evidence of active infection which is good news. This new area is actually on the opposite leg and is due to a small blister that popped up nonetheless I am hopeful that we will be able to get this under control fairly rapidly. The good news is she did get her Velcro compression wraps. She also was placed on fluid pills by her primary care provider that they are just having her take this 3 times per week again the concern is for her kidneys she apparently only has 1 functioning kidney. Integumentary (Hair, Skin) Wound #1 status is Open. Original cause of wound was Gradually Appeared. The date acquired was: 05/19/2021. The wound has been in treatment 2 weeks. The wound is located on the Left,Anterior Lower Leg. The wound  measures 0cm length x 0cm width x 0cm depth; 0cm^2 area and 0cm^3 volume. There is Fat Layer (Subcutaneous Tissue) exposed. There is no tunneling or undermining noted. There is a medium amount of  serosanguineous drainage noted. There is medium (34-66%) pink granulation within the wound bed. There is a medium (34-66%) amount of necrotic tissue within the wound bed including Adherent Slough. Wound #2 status is Open. Original cause of wound was Gradually Appeared. The date acquired was: 06/19/2021. The wound is located on the Right,Posterior Lower Leg. The wound measures 1.5cm length x 1.5cm width x 0.1cm depth; 1.767cm^2 area and 0.177cm^3 volume. There is Fat Layer (Subcutaneous Tissue) exposed. There is no tunneling or undermining noted. There is a medium amount of serosanguineous drainage noted. There is large (67-100%) red granulation within the wound bed. There is no necrotic tissue within the wound bed. AVAREE, GILBERTI ([id number]) Assessment Active Problems ICD-10 Chronic venous hypertension (idiopathic) with ulcer and inflammation of left lower extremity Non-pressure chronic ulcer of other part of left lower leg with fat layer exposed Non-pressure chronic ulcer of other part of right lower leg with fat layer exposed Type 2 diabetes mellitus with other skin ulcer Essential (primary) hypertension Chronic kidney disease, stage 3 unspecified Plan Follow-up Appointments: Return Appointment in 2 weeks. Bathing/ Shower/ Hygiene: May shower; gently cleanse wound with antibacterial soap, rinse and pat dry prior to dressing wounds Edema Control - Lymphedema / Segmental Compressive Device / Other: Elevate, Exercise Daily and Avoid Standing for Long Periods of Time. Elevate legs to the level of the heart and pump ankles as often as possible Elevate leg(s) parallel to the floor when sitting. WOUND #2: - Lower Leg Wound Laterality: Right, Posterior Cleanser: Byram Ancillary Kit - 15 Day Supply  (Generic) 3 x Per Week/30 Days Discharge Instructions: Use supplies as instructed; Kit contains: (15) Saline Bullets; (15) 3x3 Gauze; 15 pr Gloves Cleanser: Soap and Water 3 x Per Week/30 Days Discharge Instructions: Gently cleanse wound with antibacterial soap, rinse and pat dry prior to dressing wounds Primary Dressing: Silvercel 4 1/4x 4 1/4 (in/in) 3 x Per Week/30 Days Discharge Instructions: Apply Silvercel 4 1/4x 4 1/4 (in/in) as instructed Secondary Dressing: ABD Pad 5x9 (in/in) 3 x Per Week/30 Days Discharge Instructions: Cover with ABD pad 1. I would recommend currently based on what we are seeing that we go ahead and continue with the recommendation for wound care measures as before. This includes the use of a silver alginate dressing which I think is doing a good job. 2. Also can recommend an ABD pad to cover. 3. We will secure this with roll gauze and then she is can be using her juxta lite compression wraps bilaterally. We will see patient back for reevaluation in 1 week here in the clinic. If anything worsens or changes patient will contact our office for additional recommendations. Electronic Signature(s) Signed: 07/18/2021 8:26:08 AM By: Worthy Keeler PA-C Entered By: Worthy Keeler on 07/18/2021 08:26:08 Debbie Wiggins ([id number]) -------------------------------------------------------------------------------- SuperBill Details Patient Name: Debbie Wiggins Date of Service: 07/17/2021 Medical Record Number: [id number] Patient Account Number: [id number] Date of Birth/Sex: [birth year] (71 y.o. F) Treating RN: Carlene Coria Primary Care Provider: [place] Pink Other Clinician: Referring Provider: [place] Pink Treating Provider/Extender: Skipper Cliche in Treatment: 2 Diagnosis Coding ICD-10 Codes Code Description [phone] Chronic venous hypertension (idiopathic) with ulcer and inflammation of left lower extremity L97.822 Non-pressure chronic ulcer of other part of left lower  leg with fat layer exposed E11.622 Type 2 diabetes mellitus with other skin ulcer I10 Essential (primary) hypertension N18.30 Chronic kidney disease, stage 3 unspecified Facility Procedures CPT4 Code: [id number] Description: 99213 - WOUND CARE VISIT-LEV 3 EST PT Modifier: Quantity: 1 Physician Procedures  CPT4 Code Description: [id number] 91028 - WC PHYS LEVEL 3 - EST PT Modifier: Quantity: 1 CPT4 Code Description: ICD-10 Diagnosis Description I87.332 Chronic venous hypertension (idiopathic) with ulcer and inflammation of L97.822 Non-pressure chronic ulcer of other part of left lower leg with fat laye E11.622 Type 2 diabetes mellitus with  other skin ulcer I10 Essential (primary) hypertension Modifier: left lower extremity r exposed Quantity: Electronic Signature(s) Signed: 07/18/2021 8:26:28 AM By: Worthy Keeler PA-C Entered By: Worthy Keeler on 07/18/2021 90:22:84

## 2021-07-18 NOTE — Progress Notes (Addendum)
ROBENA, EWY ([id number]) Visit Report for 07/17/2021 Arrival Information Details Patient Name: Debbie Wiggins, Debbie Wiggins Date of Service: 07/17/2021 10:15 AM Medical Record Number: [id number] Patient Account Number: [id number] Date of Birth/Sex: November 16, 1950 (71 y.o. F) Treating RN: Yevonne Pax Primary Care Carriann Hesse: Jerl Mina Other Clinician: Referring Marley Charlot: Jerl Mina Treating Ceara Wrightson/Extender: Rowan Blase in Treatment: 2 Visit Information History Since Last Visit All ordered tests and consults were completed: No Patient Arrived: Ambulatory Added or deleted any medications: No Arrival Time: 10:17 Any new allergies or adverse reactions: No Accompanied By: self Had a fall or experienced change in No Transfer Assistance: None activities of daily living that may affect Patient Identification Verified: Yes risk of falls: Secondary Verification Process Completed: Yes Signs or symptoms of abuse/neglect since last visito No Patient Requires Transmission-Based Precautions: No Hospitalized since last visit: No Patient Has Alerts: No Implantable device outside of the clinic excluding No cellular tissue based products placed in the center since last visit: Has Dressing in Place as Prescribed: Yes Has Compression in Place as Prescribed: Yes Pain Present Now: No Electronic Signature(s) Signed: 07/18/2021 10:10:35 AM By: Yevonne Pax RN Entered By: Yevonne Pax on 07/17/2021 10:24:48 Debbie Wiggins ([id number]) -------------------------------------------------------------------------------- Clinic Level of Care Assessment Details Patient Name: Debbie Wiggins Date of Service: 07/17/2021 10:15 AM Medical Record Number: [id number] Patient Account Number: [id number] Date of Birth/Sex: 16-Jun-1950 (71 y.o. F) Treating RN: Yevonne Pax Primary Care Krishawn Vanderweele: Jerl Mina Other Clinician: Referring Madeleine Fenn: Jerl Mina Treating Kylie Simmonds/Extender: Rowan Blase in Treatment: 2 Clinic Level  of Care Assessment Items TOOL 4 Quantity Score X - Use when only an EandM is performed on FOLLOW-UP visit 1 0 ASSESSMENTS - Nursing Assessment / Reassessment X - Reassessment of Co-morbidities (includes updates in patient status) 1 10 X- 1 5 Reassessment of Adherence to Treatment Plan ASSESSMENTS - Wound and Skin Assessment / Reassessment []  - Simple Wound Assessment / Reassessment - one wound 0 X- 1 5 Complex Wound Assessment / Reassessment - multiple wounds []  - 0 Dermatologic / Skin Assessment (not related to wound area) ASSESSMENTS - Focused Assessment []  - Circumferential Edema Measurements - multi extremities 0 []  - 0 Nutritional Assessment / Counseling / Intervention []  - 0 Lower Extremity Assessment (monofilament, tuning fork, pulses) []  - 0 Peripheral Arterial Disease Assessment (using hand held doppler) ASSESSMENTS - Ostomy and/or Continence Assessment and Care []  - Incontinence Assessment and Management 0 []  - 0 Ostomy Care Assessment and Management (repouching, etc.) PROCESS - Coordination of Care X - Simple Patient / Family Education for ongoing care 1 15 []  - 0 Complex (extensive) Patient / Family Education for ongoing care []  - 0 Staff obtains , Records, Test Results / Process Orders []  - 0 Staff telephones HHA, Nursing Homes / Clarify orders / etc []  - 0 Routine Transfer to another Facility (non-emergent condition) []  - 0 Routine Hospital Admission (non-emergent condition) []  - 0 New Admissions / / Ordering NPWT, Apligraf, etc. []  - 0 Emergency Hospital Admission (emergent condition) X- 1 10 Simple Discharge Coordination []  - 0 Complex (extensive) Discharge Coordination PROCESS - Special Needs []  - Pediatric / Minor Patient Management 0 []  - 0 Isolation Patient Management []  - 0 Hearing / Language / Visual special needs []  - 0 Assessment of Community assistance (transportation, D/C planning, etc.) []  - 0 Additional  assistance / Altered mentation []  - 0 Support Surface(s) Assessment (bed, cushion, seat, etc.) INTERVENTIONS - Wound Cleansing / Measurement Kapur, Zyair ( ) []  - 0 Simple Wound Cleansing -  one wound X- 2 5 Complex Wound Cleansing - multiple wounds []  - 0 Wound Imaging (photographs - any number of wounds) X- 1 5 Wound Tracing (instead of photographs) []  - 0 Simple Wound Measurement - one wound X- 2 5 Complex Wound Measurement - multiple wounds INTERVENTIONS - Wound Dressings X - Small Wound Dressing one or multiple wounds 1 10 []  - 0 Medium Wound Dressing one or multiple wounds []  - 0 Large Wound Dressing one or multiple wounds []  - 0 Application of Medications - topical []  - 0 Application of Medications - injection INTERVENTIONS - Miscellaneous []  - External ear exam 0 []  - 0 Specimen Collection (cultures, biopsies, blood, body fluids, etc.) []  - 0 Specimen(s) / Culture(s) sent or taken to Lab for analysis []  - 0 Patient Transfer (multiple staff / / Similar devices) []  - 0 Simple Staple / Suture removal (25 or less) []  - 0 Complex Staple / Suture removal (26 or more) []  - 0 Hypo / Hyperglycemic Management (close monitor of Blood Glucose) []  - 0 Ankle / Brachial Index (ABI) - do not check if billed separately X- 1 5 Vital Signs Has the patient been seen at the hospital within the last three years: Yes Total Score: 85 Level Of Care: New/Established - Level 3 Electronic Signature(s) Signed: 07/18/2021 10:10:35 AM By: RN Entered By: on 07/17/2021 11:03:59 ( ) -------------------------------------------------------------------------------- Encounter Discharge Information Details Patient Name: Date of Service: 07/17/2021 10:15 AM Medical Record Number: Patient Account Number: Nurse, adult Date of Birth/Sex: 03/13/1950 (71 y.o. F) Treating RN: Primary Care Dailon Sheeran:  Other Clinician: Referring Anayeli Arel: 07/20/2021 Treating Sharryn Belding/Extender: Yevonne Pax in Treatment: 2 Encounter Discharge Information Items Discharge Condition: Stable Ambulatory Status: Ambulatory Discharge Destination: Home Transportation: Private Auto Accompanied By: self Schedule Follow-up Appointment: Yes Clinical Summary of Care: Patient Declined Electronic Signature(s) Signed: 07/18/2021 10:10:35 AM By: 07/19/2021 RN Entered By: Debbie Wiggins on 07/17/2021 11:04:53 Debbie Wiggins (07/19/2021) -------------------------------------------------------------------------------- Lower Extremity Assessment Details Patient Name: [id number] Date of Service: 07/17/2021 10:15 AM Medical Record Number: 04/27/1950 Patient Account Number: 06-04-1976 Date of Birth/Sex: 09-Mar-1950 (71 y.o. F) Treating RN: Jerl Mina Primary Care Arayah Krouse: Rowan Blase Other Clinician: Referring Sanjit Mcmichael: 07/20/2021 Treating Sally Reimers/Extender: Yevonne Pax in Treatment: 2 Edema Assessment Assessed: [Left: No] [Right: No] [Left: Edema] [Right: :] Calf Left: Right: Point of Measurement: 33 cm From Medial Instep 35.5 cm 40 cm Ankle Left: Right: Point of Measurement: 12 cm From Medial Instep 27 cm 26 cm Vascular Assessment Pulses: Dorsalis Pedis Palpable: [Left:Yes] [Right:Yes] Electronic Signature(s) Signed: 07/18/2021 10:10:35 AM By: 07/19/2021 RN Entered By: Debbie Wiggins on 07/17/2021 10:37:10 Debbie Wiggins (07/19/2021) -------------------------------------------------------------------------------- Multi Wound Chart Details Patient Name: [id number] Date of Service: 07/17/2021 10:15 AM Medical Record Number: 04/27/1950 Patient Account Number: [id number] Date of Birth/Sex: 1950-09-11 (71 y.o. F) Treating RN: Jerl Mina Primary Care Traevon Meiring: Rowan Blase Other Clinician: Referring Lynnie Koehler: 07/20/2021 Treating Alivia Cimino/Extender: Yevonne Pax in  Treatment: 2 Vital Signs Height(in): 64 Pulse(bpm): 72 Weight(lbs): 311 Blood Pressure(mmHg): 141/76 Body Mass Index(BMI): 53.4 Temperature(F): 97.6 Respiratory Rate(breaths/min): 18 Photos: [N/A:N/A] Wound Location: Left, Anterior Lower Leg Right, Posterior Lower Leg N/A Wounding Event: Gradually Appeared Gradually Appeared N/A Primary Etiology: Venous Leg Ulcer Venous Leg Ulcer N/A Comorbid History: Type II Diabetes Type II Diabetes N/A Date Acquired: 05/19/2021 06/19/2021 N/A Weeks of Treatment: 2 0 N/A Wound Status: Open Open N/A Wound Recurrence: No No  N/A Measurements L x W x D (cm) 0x0x0 1.5x1.5x0.1 N/A Area (cm) : 0 1.767 N/A Volume (cm) : 0 0.177 N/A % Reduction in Area: 100.00% N/A N/A % Reduction in Volume: 100.00% N/A N/A Classification: Full Thickness Without Exposed Full Thickness Without Exposed N/A Support Structures Support Structures Exudate Amount: Medium Medium N/A Exudate Type: Serosanguineous Serosanguineous N/A Exudate Color: red, brown red, brown N/A Granulation Amount: Medium (34-66%) Large (67-100%) N/A Granulation Quality: Pink Red N/A Necrotic Amount: Medium (34-66%) None Present (0%) N/A Exposed Structures: Fat Layer (Subcutaneous Tissue): Fat Layer (Subcutaneous Tissue): N/A Yes Yes Fascia: No Fascia: No Tendon: No Tendon: No Muscle: No Muscle: No Joint: No Joint: No Bone: No Bone: No Epithelialization: None None N/A Treatment Notes Electronic Signature(s) Signed: 07/18/2021 10:10:35 AM By: Yevonne Pax RN Entered By: Yevonne Pax on 07/17/2021 10:37:25 Debbie Wiggins ([id number]) -------------------------------------------------------------------------------- Multi-Disciplinary Care Plan Details Patient Name: Debbie Wiggins Date of Service: 07/17/2021 10:15 AM Medical Record Number: [id number] Patient Account Number: [id number] Date of Birth/Sex: [birth year] (71 y.o. F) Treating RN: Yevonne Pax Primary Care Estellar Cadena: Jerl Mina Other  Clinician: Referring Xylan Sheils: Jerl Mina Treating Brennan Litzinger/Extender: Rowan Blase in Treatment: 2 Active Inactive Electronic Signature(s) Signed: 09/04/2021 5:09:28 PM By: Elliot Gurney BSN, RN, CWS, Kim RN, BSN Signed: 10/06/2021 2:33:12 PM By: Yevonne Pax RN Previous Signature: 07/18/2021 10:10:35 AM Version By: Yevonne Pax RN Entered By: Elliot Gurney BSN, RN, CWS, Kim on 09/04/2021 17:09:27 Debbie Wiggins ([id number]) -------------------------------------------------------------------------------- Pain Assessment Details Patient Name: Debbie Wiggins Date of Service: 07/17/2021 10:15 AM Medical Record Number: [id number] Patient Account Number: [id number] Date of Birth/Sex: 06/01/1950 (71 y.o. F) Treating RN: Yevonne Pax Primary Care Purcell Jungbluth: Jerl Mina Other Clinician: Referring Terresa Marlett: Jerl Mina Treating Ames Hoban/Extender: Rowan Blase in Treatment: 2 Active Problems Location of Pain Severity and Description of Pain Patient Has Paino No Site Locations Pain Management and Medication Current Pain Management: Electronic Signature(s) Signed: 07/18/2021 10:10:35 AM By: Yevonne Pax RN Entered By: Yevonne Pax on 07/17/2021 10:25:59 Debbie Wiggins ([id number]) -------------------------------------------------------------------------------- Patient/Caregiver Education Details Patient Name: Debbie Wiggins Date of Service: 07/17/2021 10:15 AM Medical Record Number: [id number] Patient Account Number: [id number] Date of Birth/Gender: 01-18-1951 (71 y.o. F) Treating RN: Yevonne Pax Primary Care Physician: Jerl Mina Other Clinician: Referring Physician: Jerl Mina Treating Physician/Extender: Rowan Blase in Treatment: 2 Education Assessment Education Provided To: Patient Education Topics Provided Wound/Skin Impairment: Methods: Explain/Verbal Responses: State content correctly Electronic Signature(s) Signed: 07/18/2021 10:10:35 AM By: Yevonne Pax RN Entered By: Yevonne Pax on 07/17/2021 11:04:13 Debbie Wiggins ([id number]) -------------------------------------------------------------------------------- Wound Assessment Details Patient Name: Debbie Wiggins Date of Service: 07/17/2021 10:15 AM Medical Record Number: [id number] Patient Account Number: [id number] Date of Birth/Sex: 10-12-1950 (71 y.o. F) Treating RN: Yevonne Pax Primary Care Darline Faith: Jerl Mina Other Clinician: Referring Katrece Roediger: Jerl Mina Treating Clarivel Callaway/Extender: Rowan Blase in Treatment: 2 Wound Status Wound Number: 1 Primary Etiology: Venous Leg Ulcer Wound Location: Left, Anterior Lower Leg Wound Status: Open Wounding Event: Gradually Appeared Comorbid History: Type II Diabetes Date Acquired: 05/19/2021 Weeks Of Treatment: 2 Clustered Wound: No Photos Wound Measurements Length: (cm) 0 Width: (cm) 0 Depth: (cm) 0 Area: (cm) Volume: (cm) % Reduction in Area: 100% % Reduction in Volume: 100% Epithelialization: None 0 Tunneling: No 0 Undermining: No Wound Description Classification: Full Thickness Without Exposed Support Structu Exudate Amount: Medium Exudate Type: Serosanguineous Exudate Color: red, brown res Foul Odor After Cleansing: No Slough/Fibrino Yes Wound Bed Granulation Amount: Medium (34-66%) Exposed Structure Granulation Quality: Pink Fascia Exposed: No Necrotic Amount:  Medium (34-66%) Fat Layer (Subcutaneous Tissue) Exposed: Yes Necrotic Quality: Adherent Slough Tendon Exposed: No Muscle Exposed: No Joint Exposed: No Bone Exposed: No Electronic Signature(s) Signed: 07/18/2021 10:10:35 AM By: Yevonne Pax RN Entered By: Yevonne Pax on 07/17/2021 10:33:30 Debbie Wiggins ([id number]) -------------------------------------------------------------------------------- Wound Assessment Details Patient Name: Debbie Wiggins Date of Service: 07/17/2021 10:15 AM Medical Record Number: [id number] Patient Account Number: [id number] Date of Birth/Sex:  1950-10-15 (71 y.o. F) Treating RN: Yevonne Pax Primary Care Hanish Laraia: Jerl Mina Other Clinician: Referring Jyssica Rief: Jerl Mina Treating Marybel Alcott/Extender: Rowan Blase in Treatment: 2 Wound Status Wound Number: 2 Primary Etiology: Venous Leg Ulcer Wound Location: Right, Posterior Lower Leg Wound Status: Open Wounding Event: Gradually Appeared Comorbid History: Type II Diabetes Date Acquired: 06/19/2021 Weeks Of Treatment: 0 Clustered Wound: No Photos Wound Measurements Length: (cm) 1.5 Width: (cm) 1.5 Depth: (cm) 0.1 Area: (cm) 1.767 Volume: (cm) 0.177 % Reduction in Area: % Reduction in Volume: Epithelialization: None Tunneling: No Undermining: No Wound Description Classification: Full Thickness Without Exposed Support Structu Exudate Amount: Medium Exudate Type: Serosanguineous Exudate Color: red, brown res Foul Odor After Cleansing: No Slough/Fibrino No Wound Bed Granulation Amount: Large (67-100%) Exposed Structure Granulation Quality: Red Fascia Exposed: No Necrotic Amount: None Present (0%) Fat Layer (Subcutaneous Tissue) Exposed: Yes Tendon Exposed: No Muscle Exposed: No Joint Exposed: No Bone Exposed: No Electronic Signature(s) Signed: 07/18/2021 10:10:35 AM By: Yevonne Pax RN Entered By: Yevonne Pax on 07/17/2021 10:35:41 Debbie Wiggins ([id number]) -------------------------------------------------------------------------------- Vitals Details Patient Name: Debbie Wiggins Date of Service: 07/17/2021 10:15 AM Medical Record Number: [id number] Patient Account Number: [id number] Date of Birth/Sex: 06/16/1950 (71 y.o. F) Treating RN: Yevonne Pax Primary Care Fredy Gladu: Jerl Mina Other Clinician: Referring Walid Haig: Jerl Mina Treating Reshonda Koerber/Extender: Rowan Blase in Treatment: 2 Vital Signs Time Taken: 10:25 Temperature (F): 97.6 Height (in): 64 Pulse (bpm): 72 Weight (lbs): 311 Respiratory Rate (breaths/min): 18 Body  Mass Index (BMI): 53.4 Blood Pressure (mmHg): 141/76 Reference Range: 80 - 120 mg / dl Electronic Signature(s) Signed: 07/18/2021 10:10:35 AM By: Yevonne Pax RN Entered By: Yevonne Pax on 07/17/2021 10:25:49

## 2021-07-25 ENCOUNTER — Ambulatory Visit: Payer: Medicare Other | Admitting: Physician Assistant

## 2021-08-01 ENCOUNTER — Ambulatory Visit: Payer: Medicare Other | Admitting: Physician Assistant

## 2021-08-08 ENCOUNTER — Ambulatory Visit: Payer: Medicare Other | Admitting: Physician Assistant

## 2021-08-21 ENCOUNTER — Ambulatory Visit: Payer: Medicare Other | Admitting: Physician Assistant

## 2021-08-21 ENCOUNTER — Ambulatory Visit: Payer: Medicare Other | Admitting: Podiatry

## 2021-09-04 ENCOUNTER — Ambulatory Visit: Payer: Medicare Other | Admitting: Podiatry

## 2021-09-05 ENCOUNTER — Ambulatory Visit: Payer: Medicare Other | Admitting: Physician Assistant

## 2021-09-25 ENCOUNTER — Ambulatory Visit: Payer: Medicare Other | Admitting: Podiatry

## 2021-10-16 ENCOUNTER — Ambulatory Visit: Payer: Medicare Other | Admitting: Podiatry

## 2021-10-17 ENCOUNTER — Ambulatory Visit: Payer: Medicare Other | Admitting: Physician Assistant

## 2021-10-20 DIAGNOSIS — N1832 Chronic kidney disease, stage 3b: Secondary | ICD-10-CM | POA: Diagnosis not present

## 2021-10-20 DIAGNOSIS — N2581 Secondary hyperparathyroidism of renal origin: Secondary | ICD-10-CM | POA: Diagnosis not present

## 2021-10-20 DIAGNOSIS — I10 Essential (primary) hypertension: Secondary | ICD-10-CM | POA: Diagnosis not present

## 2021-10-20 DIAGNOSIS — E1122 Type 2 diabetes mellitus with diabetic chronic kidney disease: Secondary | ICD-10-CM | POA: Diagnosis not present

## 2021-10-22 DIAGNOSIS — I10 Essential (primary) hypertension: Secondary | ICD-10-CM | POA: Diagnosis not present

## 2021-10-22 DIAGNOSIS — N1832 Chronic kidney disease, stage 3b: Secondary | ICD-10-CM | POA: Diagnosis not present

## 2021-10-22 DIAGNOSIS — N2581 Secondary hyperparathyroidism of renal origin: Secondary | ICD-10-CM | POA: Diagnosis not present

## 2021-10-22 DIAGNOSIS — E1122 Type 2 diabetes mellitus with diabetic chronic kidney disease: Secondary | ICD-10-CM | POA: Diagnosis not present

## 2021-10-23 DIAGNOSIS — H401134 Primary open-angle glaucoma, bilateral, indeterminate stage: Secondary | ICD-10-CM | POA: Diagnosis not present

## 2021-10-24 DIAGNOSIS — H401131 Primary open-angle glaucoma, bilateral, mild stage: Secondary | ICD-10-CM | POA: Diagnosis not present

## 2021-11-24 DIAGNOSIS — H2512 Age-related nuclear cataract, left eye: Secondary | ICD-10-CM | POA: Diagnosis not present

## 2021-11-25 ENCOUNTER — Encounter: Payer: Self-pay | Admitting: Ophthalmology

## 2021-12-02 NOTE — Discharge Instructions (Signed)

## 2021-12-03 ENCOUNTER — Ambulatory Visit: Payer: Medicare Other | Admitting: Anesthesiology

## 2021-12-03 ENCOUNTER — Ambulatory Visit
Admission: RE | Admit: 2021-12-03 | Discharge: 2021-12-03 | Disposition: A | Discharge status: Home / Self Care | Payer: Medicare Other | Attending: Ophthalmology | Admitting: Ophthalmology

## 2021-12-03 ENCOUNTER — Other Ambulatory Visit: Payer: Self-pay

## 2021-12-03 ENCOUNTER — Encounter
Admission: RE | Disposition: A | Discharge status: Home / Self Care | Payer: Self-pay | Source: Home / Self Care | Attending: Ophthalmology

## 2021-12-03 ENCOUNTER — Encounter: Payer: Self-pay | Admitting: Ophthalmology

## 2021-12-03 DIAGNOSIS — H2512 Age-related nuclear cataract, left eye: Secondary | ICD-10-CM | POA: Diagnosis not present

## 2021-12-03 DIAGNOSIS — Z950 Presence of cardiac pacemaker: Secondary | ICD-10-CM | POA: Insufficient documentation

## 2021-12-03 DIAGNOSIS — Z794 Long term (current) use of insulin: Secondary | ICD-10-CM | POA: Insufficient documentation

## 2021-12-03 DIAGNOSIS — E1122 Type 2 diabetes mellitus with diabetic chronic kidney disease: Secondary | ICD-10-CM | POA: Insufficient documentation

## 2021-12-03 DIAGNOSIS — E1151 Type 2 diabetes mellitus with diabetic peripheral angiopathy without gangrene: Secondary | ICD-10-CM | POA: Diagnosis not present

## 2021-12-03 DIAGNOSIS — M109 Gout, unspecified: Secondary | ICD-10-CM | POA: Diagnosis not present

## 2021-12-03 DIAGNOSIS — I2510 Atherosclerotic heart disease of native coronary artery without angina pectoris: Secondary | ICD-10-CM | POA: Diagnosis not present

## 2021-12-03 DIAGNOSIS — F32A Depression, unspecified: Secondary | ICD-10-CM | POA: Diagnosis not present

## 2021-12-03 DIAGNOSIS — Z6841 Body Mass Index (BMI) 40.0 and over, adult: Secondary | ICD-10-CM | POA: Insufficient documentation

## 2021-12-03 DIAGNOSIS — I10 Essential (primary) hypertension: Secondary | ICD-10-CM | POA: Diagnosis not present

## 2021-12-03 DIAGNOSIS — N183 Chronic kidney disease, stage 3 unspecified: Secondary | ICD-10-CM | POA: Insufficient documentation

## 2021-12-03 DIAGNOSIS — E1140 Type 2 diabetes mellitus with diabetic neuropathy, unspecified: Secondary | ICD-10-CM | POA: Diagnosis not present

## 2021-12-03 DIAGNOSIS — Z7984 Long term (current) use of oral hypoglycemic drugs: Secondary | ICD-10-CM | POA: Insufficient documentation

## 2021-12-03 DIAGNOSIS — E1136 Type 2 diabetes mellitus with diabetic cataract: Secondary | ICD-10-CM | POA: Diagnosis not present

## 2021-12-03 DIAGNOSIS — I129 Hypertensive chronic kidney disease with stage 1 through stage 4 chronic kidney disease, or unspecified chronic kidney disease: Secondary | ICD-10-CM | POA: Diagnosis not present

## 2021-12-03 HISTORY — DX: Bradycardia, unspecified: R00.1

## 2021-12-03 HISTORY — DX: Essential (primary) hypertension: I10

## 2021-12-03 HISTORY — DX: Other specified disorders of veins: I87.8

## 2021-12-03 HISTORY — DX: Atherosclerotic heart disease of native coronary artery without angina pectoris: I25.10

## 2021-12-03 HISTORY — DX: Gout, unspecified: M10.9

## 2021-12-03 HISTORY — DX: Polyneuropathy, unspecified: G62.9

## 2021-12-03 HISTORY — DX: Depression, unspecified: F32.A

## 2021-12-03 HISTORY — PX: CATARACT EXTRACTION W/PHACO: SHX586

## 2021-12-03 HISTORY — DX: Chronic kidney disease, stage 3 unspecified: N18.30

## 2021-12-03 LAB — GLUCOSE, CAPILLARY: Glucose-Capillary: 96 mg/dL (ref 70–99)

## 2021-12-03 SURGERY — PHACOEMULSIFICATION, CATARACT, WITH IOL INSERTION
Anesthesia: Monitor Anesthesia Care | Site: Eye | Laterality: Left

## 2021-12-03 MED ORDER — MIDAZOLAM HCL 2 MG/2ML IJ SOLN
INTRAMUSCULAR | Status: DC | PRN
  Administered 2021-12-03: 2 mg via INTRAVENOUS

## 2021-12-03 MED ORDER — BRIMONIDINE TARTRATE-TIMOLOL 0.2-0.5 % OP SOLN
OPHTHALMIC | Status: DC | PRN
  Administered 2021-12-03: 1 [drp] via OPHTHALMIC

## 2021-12-03 MED ORDER — SIGHTPATH DOSE#1 NA HYALUR & NA CHOND-NA HYALUR IO KIT
PACK | INTRAOCULAR | Status: DC | PRN
  Administered 2021-12-03: 1 via OPHTHALMIC

## 2021-12-03 MED ORDER — MOXIFLOXACIN HCL 0.5 % OP SOLN
OPHTHALMIC | Status: DC | PRN
  Administered 2021-12-03: .2 mL via OPHTHALMIC

## 2021-12-03 MED ORDER — SIGHTPATH DOSE#1 BSS IO SOLN
INTRAOCULAR | Status: DC | PRN
  Administered 2021-12-03: 1 mL via INTRAMUSCULAR

## 2021-12-03 MED ORDER — FENTANYL CITRATE (PF) 100 MCG/2ML IJ SOLN
INTRAMUSCULAR | Status: DC | PRN
  Administered 2021-12-03 (×2): 50 ug via INTRAVENOUS

## 2021-12-03 MED ORDER — TETRACAINE HCL 0.5 % OP SOLN
1.0000 [drp] | OPHTHALMIC | Status: DC | PRN
  Administered 2021-12-03 (×3): 1 [drp] via OPHTHALMIC

## 2021-12-03 MED ORDER — SIGHTPATH DOSE#1 BSS IO SOLN
INTRAOCULAR | Status: DC | PRN
  Administered 2021-12-03: 15 mL

## 2021-12-03 MED ORDER — ARMC OPHTHALMIC DILATING DROPS
1.0000 | OPHTHALMIC | Status: DC | PRN
  Administered 2021-12-03 (×3): 1 via OPHTHALMIC

## 2021-12-03 MED ORDER — SIGHTPATH DOSE#1 BSS IO SOLN
INTRAOCULAR | Status: DC | PRN
  Administered 2021-12-03: 62 mL via OPHTHALMIC

## 2021-12-03 SURGICAL SUPPLY — 11 items
CATARACT SUITE SIGHTPATH (MISCELLANEOUS) ×1 IMPLANT
FEE CATARACT SUITE SIGHTPATH (MISCELLANEOUS) ×1 IMPLANT
GLOVE SRG 8 PF TXTR STRL LF DI (GLOVE) ×1 IMPLANT
GLOVE SURG ENC TEXT LTX SZ7.5 (GLOVE) ×1 IMPLANT
GLOVE SURG UNDER POLY LF SZ8 (GLOVE) ×1
LENS CLAREON VIVITY TORIC 11.5 ×1 IMPLANT
LENS IOL CLRN VT TRC 3 11.5 IMPLANT
NDL FILTER BLUNT 18X1 1/2 (NEEDLE) ×1 IMPLANT
NEEDLE FILTER BLUNT 18X1 1/2 (NEEDLE) ×1 IMPLANT
SYR 3ML LL SCALE MARK (SYRINGE) ×1 IMPLANT
WATER STERILE IRR 250ML POUR (IV SOLUTION) ×1 IMPLANT

## 2021-12-03 NOTE — H&P (Signed)
[facility]   Primary Care Physician:  [place] Pink, MD Ophthalmologist: Dr. Leandrew Koyanagi  Pre-Procedure History & Physical: HPI:  Debbie Wiggins is a 71 y.o. female here for ophthalmic surgery.   Past Medical History:  Diagnosis Date   Bradycardia    Pacemaker placed in 2014   CKD (chronic kidney disease), stage III ([place])    Coronary artery disease    Depression    Diabetes mellitus without complication ([place])    Gout    Hypertension    Neuropathy    hands and feet   Presence of permanent cardiac pacemaker 2014   Venous stasis of both lower extremities     Past Surgical History:  Procedure Laterality Date   INSERT / REPLACE / REMOVE PACEMAKER  2014   NASAL SEPTUM SURGERY     in 1990s    Prior to Admission medications   Medication Sig Start Date End Date Taking? Authorizing Provider  acetaminophen (TYLENOL) 325 MG tablet Take 650 mg by mouth every 6 (six) hours as needed.   Yes [provider]  allopurinol (ZYLOPRIM) 100 MG tablet Take 100 mg by mouth daily. 09/18/20  Yes [provider]  aspirin 81 MG EC tablet Take by mouth.   Yes [provider]  carvedilol (COREG) 6.25 MG tablet Take 6.25 mg by mouth 2 (two) times daily with a meal.   Yes [provider]  cholecalciferol (VITAMIN D3) 25 MCG (1000 UNIT) tablet Take 1,000 Units by mouth daily.   Yes [provider]  colchicine 0.6 MG tablet Take 0.6 mg by mouth daily as needed.   Yes [provider]  dapagliflozin propanediol (FARXIGA) 10 MG TABS tablet Take 10 mg by mouth daily.   Yes [provider]  DULoxetine (CYMBALTA) 30 MG capsule  02/26/18  Yes [provider]  gabapentin (NEURONTIN) 100 MG capsule Take 100 mg by mouth 3 (three) times daily as needed.   Yes [provider]  HUMULIN 70/30 (70-30) 100 UNIT/ML injection SMARTSIG:34 Unit(s) SUB-Q Twice Daily 12/02/18  Yes [provider]  latanoprost (XALATAN) 0.005 %  ophthalmic solution Apply to eye. 05/13/17  Yes [provider]  loratadine (CLARITIN) 10 MG tablet Take by mouth.   Yes [provider]  mometasone (NASONEX) 50 MCG/ACT nasal spray Place 2 sprays into the nose daily.   Yes [provider]  pioglitazone (ACTOS) 15 MG tablet Take by mouth. 07/18/20 11/25/21 Yes [provider]  rosuvastatin (CRESTOR) 10 MG tablet Take 10 mg by mouth daily.   Yes [provider]  sodium bicarbonate 650 MG tablet TK 1 T PO BID 02/02/18  Yes [provider]  timolol (TIMOPTIC) 0.5 % ophthalmic solution 1 drop every morning. 09/01/20  Yes [provider]  torsemide (DEMADEX) 10 MG tablet Take 10 mg by mouth 3 (three) times a week.   Yes [provider]  traZODone (DESYREL) 50 MG tablet Take 50 mg by mouth at bedtime. 07/18/20  Yes [provider]  valsartan (DIOVAN) 160 MG tablet Take 160 mg by mouth daily.   Yes [provider]  BD INSULIN SYRINGE U/F 31G X 5/16" 0.3 ML MISC 2 (two) times daily. 10/31/18   [provider]  Blood Glucose Monitoring Suppl (FIFTY50 GLUCOSE METER 2.0) w/Device KIT Use as directed (check blood glucose 3 times daily.) 11/16/17   [provider]  Blood Pressure Monitoring (BLOOD PRESSURE CUFF) MISC by Does not apply route. 03/19/20   [provider]  Insulin Pen Needle (EXEL COMFORT POINT PEN NEEDLE) 29G X 12MM MISC Use as directed 11/16/17   [provider]  Lancets (ONETOUCH DELICA PLUS EXHBZJ69C) [place] USE  TID UTD 12/02/18   [provider]  Glory Rosebush VERIO test strip USE TID UTD 11/30/18   [provider]    Allergies as of 10/31/2021 - Review Complete 05/26/2021  Allergen Reaction Noted   Neosporin original [bacitracin-neomycin-polymyxin]  01/30/2021   Ceftin [cefuroxime axetil] Swelling and Rash 05/25/2017   Cefuroxime Other (See Comments), Rash, and Swelling 05/25/2017   Latex Swelling, Rash, and  Other (See Comments) 05/25/2017   Nickel Rash 11/15/2017   Penicillins Swelling, Rash, and Other (See Comments) 05/25/2017   Sulfa antibiotics Swelling and Rash 05/25/2017    History reviewed. No pertinent family history.  Social History   Socioeconomic History   Marital status: Widowed    Spouse name: Not on file   Number of children: Not on file   Years of education: Not on file   Highest education level: Not on file  Occupational History   Not on file  Tobacco Use   Smoking status: Never   Smokeless tobacco: Never  Vaping Use   Vaping Use: Never used  Substance and Sexual Activity   Alcohol use: Never   Drug use: Never   Sexual activity: Not on file  Other Topics Concern   Not on file  Social History Narrative   Not on file   Social Determinants of Health   Financial Resource Strain: Not on file  Food Insecurity: Not on file  Transportation Needs: Not on file  Physical Activity: Not on file  Stress: Not on file  Social Connections: Not on file  Intimate Partner Violence: Not on file    Review of Systems: See HPI, otherwise negative ROS  Physical Exam: BP 130/78   Pulse 72   Temp 98 F (36.7 C) (Temporal)   Resp 18   Ht _0  (1.626 m)   Wt (!) 142.9 kg   SpO2 95%   BMI 54.07 kg/m  General:   Alert,  pleasant and cooperative in NAD Head:  Normocephalic and atraumatic. Lungs:  Clear to auscultation.    Heart:  Regular rate and rhythm.   Impression/Plan: Debbie Wiggins is here for ophthalmic surgery.  Risks, benefits, limitations, and alternatives regarding ophthalmic surgery have been reviewed with the patient.  Questions have been answered.  All parties agreeable.   Leandrew Koyanagi, MD  12/03/2021, 11:00 AM

## 2021-12-03 NOTE — Op Note (Signed)
LOCATION:  [facility]   PREOPERATIVE DIAGNOSIS:  Nuclear sclerotic cataract of the left eye.  H25.12  POSTOPERATIVE DIAGNOSIS:  Nuclear sclerotic cataract of the left eye.   PROCEDURE:  Phacoemulsification with Toric posterior chamber intraocular lens placement of the left eye.  Ultrasound time: Procedure(s) with comments: CATARACT EXTRACTION PHACO AND INTRAOCULAR LENS PLACEMENT (IOC) LEFT DIABETIC  CLARION VIVITY TORIC LENS  4.74  00:45.9 (Left) - Diabetic  LENS:   Implant Name Type Inv. Item Serial No. Manufacturer Lot No. LRB No. Used Action  LENS CLAREON VIVITY TORIC 11.5 - [id number]  LENS CLAREON VIVITY TORIC 11.5 [id number] SIGHTPATH  Left 1 Implanted     [id number] 11.5 Toric intraocular lens with 1.5 diopters of cylindrical power with axis orientation at 180 degrees.     SURGEON:  Deirdre Evener, MD   ANESTHESIA:  Topical with tetracaine drops and 2% Xylocaine jelly, augmented with 1% preservative-free intracameral lidocaine.  COMPLICATIONS:  None.   DESCRIPTION OF PROCEDURE:  The patient was identified in the holding room and transported to the operating suite and placed in the supine position under the operating microscope.  The left eye was identified as the operative eye, and it was prepped and draped in the usual sterile ophthalmic fashion.    A clear-corneal paracentesis incision was made at the 1:30 position.  0.5 ml of preservative-free 1% lidocaine was injected into the anterior chamber. The anterior chamber was filled with Viscoat.  A 2.4 millimeter near clear corneal incision was then made at the 10:30 position.  A cystotome and capsulorrhexis forceps were then used to make a curvilinear capsulorrhexis.  Hydrodissection and hydrodelineation were then performed using balanced salt solution.   Phacoemulsification was then used in stop and chop fashion to remove the lens, nucleus and epinucleus.  The remaining cortex was aspirated using the irrigation and  aspiration handpiece.  Provisc viscoelastic was then placed into the capsular bag to distend it for lens placement.  The Verion digital marker was used to align the implant at the intended axis.   A Toric lens was then injected into the capsular bag.  It was rotated clockwise until the axis marks on the lens were approximately 15 degrees in the counterclockwise direction to the intended alignment.  The viscoelastic was aspirated from the eye using the irrigation aspiration handpiece.  Then, a Koch spatula through the sideport incision was used to rotate the lens in a clockwise direction until the axis markings of the intraocular lens were lined up with the Verion alignment.  Balanced salt solution was then used to hydrate the wounds. Vigamox 0.2 ml of a 1mg  per ml solution was injected into the anterior chamber for a dose of 0.2 mg of intracameral antibiotic at the completion of the case.    The eye was noted to have a physiologic pressure and there was no wound leak noted.   Timolol and Brimonidine drops were applied to the eye.  The patient was taken to the recovery room in stable condition having had no complications of anesthesia or surgery.  Berthe Oley 12/03/2021, 11:44 AM

## 2021-12-03 NOTE — Anesthesia Postprocedure Evaluation (Signed)
Anesthesia Post Note  Patient: Debbie Wiggins  Procedure(s) Performed: CATARACT EXTRACTION PHACO AND INTRAOCULAR LENS PLACEMENT (IOC) LEFT DIABETIC  CLARION VIVITY TORIC LENS  4.74  00:45.9 (Left: Eye)  Patient location during evaluation: PACU Anesthesia Type: MAC Level of consciousness: awake and alert Pain management: pain level controlled Vital Signs Assessment: post-procedure vital signs reviewed and stable Respiratory status: spontaneous breathing, nonlabored ventilation, respiratory function stable and patient connected to nasal cannula oxygen Cardiovascular status: stable and blood pressure returned to baseline Postop Assessment: no apparent nausea or vomiting Anesthetic complications: no   There were no known notable events for this encounter.   Last Vitals:  Vitals:   12/03/21 1145 12/03/21 1152  BP: (!) 136/93 126/63  Pulse: 72 67  Resp: 20 15  Temp: (!) 36.4 C (!) 36.4 C  SpO2: 98% 95%    Last Pain:  Vitals:   12/03/21 1145  TempSrc:   PainSc: 0-No pain                 Ilene Qua

## 2021-12-03 NOTE — Anesthesia Preprocedure Evaluation (Signed)
Anesthesia Evaluation  Patient identified by MRN, date of birth, ID band Patient awake    Reviewed: Allergy & Precautions, NPO status , Patient's Chart, lab work & pertinent test results  History of Anesthesia Complications Negative for: history of anesthetic complications  Airway Mallampati: III  TM Distance: >3 FB Neck ROM: full    Dental  (+) Chipped, Teeth Intact   Pulmonary shortness of breath and with exertion   Pulmonary exam normal        Cardiovascular Exercise Tolerance: Poor hypertension, + CAD and + Peripheral Vascular Disease  Normal cardiovascular exam+ pacemaker      Neuro/Psych  Neuromuscular disease  negative psych ROS   GI/Hepatic negative GI ROS, Neg liver ROS,,,  Endo/Other  diabetes  Morbid obesity  Renal/GU Renal disease     Musculoskeletal negative musculoskeletal ROS (+)    Abdominal   Peds  Hematology negative hematology ROS (+)   Anesthesia Other Findings Past Medical History: No date: Bradycardia     Comment:  Pacemaker placed in 2014 No date: CKD (chronic kidney disease), stage III (HCC) No date: Coronary artery disease No date: Depression No date: Diabetes mellitus without complication (HCC) No date: Gout No date: Hypertension No date: Neuropathy     Comment:  hands and feet 2014: Presence of permanent cardiac pacemaker No date: Venous stasis of both lower extremities  Past Surgical History: 2014: INSERT / REPLACE / REMOVE PACEMAKER No date: NASAL SEPTUM SURGERY     Comment:  in 58s  BMI    Body Mass Index: 54.07 kg/m      Reproductive/Obstetrics negative OB ROS                              Anesthesia Physical Anesthesia Plan  ASA: 4  Anesthesia Plan: MAC   Post-op Pain Management: Minimal or no pain anticipated   Induction: Intravenous  PONV Risk Score and Plan:   Airway Management Planned: Natural Airway and Nasal  Cannula  Additional Equipment:   Intra-op Plan:   Post-operative Plan:   Informed Consent: I have reviewed the patients History and Physical, chart, labs and discussed the procedure including the risks, benefits and alternatives for the proposed anesthesia with the patient or authorized representative who has indicated his/her understanding and acceptance.     Dental Advisory Given  Plan Discussed with: Anesthesiologist, CRNA and Surgeon  Anesthesia Plan Comments: (Patient consented for risks of anesthesia including but not limited to:  - adverse reactions to medications - damage to eyes, teeth, lips or other oral mucosa - nerve damage due to positioning  - sore throat or hoarseness - Damage to heart, brain, nerves, lungs, other parts of body or loss of life  Patient voiced understanding.)         Anesthesia Quick Evaluation

## 2021-12-03 NOTE — Transfer of Care (Signed)
Immediate Anesthesia Transfer of Care Note  Patient: Debbie Wiggins  Procedure(s) Performed: CATARACT EXTRACTION PHACO AND INTRAOCULAR LENS PLACEMENT (IOC) LEFT DIABETIC  CLARION VIVITY TORIC LENS  4.74  00:45.9 (Left: Eye)  Patient Location: PACU  Anesthesia Type: MAC  Level of Consciousness: awake, alert  and patient cooperative  Airway and Oxygen Therapy: Patient Spontanous Breathing and Patient connected to supplemental oxygen  Post-op Assessment: Post-op Vital signs reviewed, Patient's Cardiovascular Status Stable, Respiratory Function Stable, Patent Airway and No signs of Nausea or vomiting  Post-op Vital Signs: Reviewed and stable  Complications: There were no known notable events for this encounter.

## 2021-12-04 ENCOUNTER — Other Ambulatory Visit: Payer: Self-pay

## 2021-12-04 ENCOUNTER — Encounter: Payer: Self-pay | Admitting: Ophthalmology

## 2021-12-09 DIAGNOSIS — H2511 Age-related nuclear cataract, right eye: Secondary | ICD-10-CM | POA: Diagnosis not present

## 2021-12-15 DIAGNOSIS — Z23 Encounter for immunization: Secondary | ICD-10-CM | POA: Diagnosis not present

## 2021-12-15 DIAGNOSIS — E1122 Type 2 diabetes mellitus with diabetic chronic kidney disease: Secondary | ICD-10-CM | POA: Diagnosis not present

## 2021-12-15 DIAGNOSIS — I129 Hypertensive chronic kidney disease with stage 1 through stage 4 chronic kidney disease, or unspecified chronic kidney disease: Secondary | ICD-10-CM | POA: Diagnosis not present

## 2021-12-15 DIAGNOSIS — E559 Vitamin D deficiency, unspecified: Secondary | ICD-10-CM | POA: Diagnosis not present

## 2021-12-15 DIAGNOSIS — E118 Type 2 diabetes mellitus with unspecified complications: Secondary | ICD-10-CM | POA: Diagnosis not present

## 2021-12-15 NOTE — Discharge Instructions (Signed)

## 2021-12-17 ENCOUNTER — Ambulatory Visit
Admission: RE | Admit: 2021-12-17 | Discharge: 2021-12-17 | Disposition: A | Discharge status: Home / Self Care | Payer: Medicare Other | Attending: Ophthalmology | Admitting: Ophthalmology

## 2021-12-17 ENCOUNTER — Ambulatory Visit: Payer: Medicare Other | Admitting: Anesthesiology

## 2021-12-17 ENCOUNTER — Encounter
Admission: RE | Disposition: A | Discharge status: Home / Self Care | Payer: Self-pay | Source: Home / Self Care | Attending: Ophthalmology

## 2021-12-17 ENCOUNTER — Other Ambulatory Visit: Payer: Self-pay

## 2021-12-17 DIAGNOSIS — Z794 Long term (current) use of insulin: Secondary | ICD-10-CM | POA: Insufficient documentation

## 2021-12-17 DIAGNOSIS — E1122 Type 2 diabetes mellitus with diabetic chronic kidney disease: Secondary | ICD-10-CM | POA: Insufficient documentation

## 2021-12-17 DIAGNOSIS — G709 Myoneural disorder, unspecified: Secondary | ICD-10-CM | POA: Insufficient documentation

## 2021-12-17 DIAGNOSIS — E1136 Type 2 diabetes mellitus with diabetic cataract: Secondary | ICD-10-CM | POA: Insufficient documentation

## 2021-12-17 DIAGNOSIS — I2510 Atherosclerotic heart disease of native coronary artery without angina pectoris: Secondary | ICD-10-CM | POA: Diagnosis not present

## 2021-12-17 DIAGNOSIS — N183 Chronic kidney disease, stage 3 unspecified: Secondary | ICD-10-CM | POA: Insufficient documentation

## 2021-12-17 DIAGNOSIS — F32A Depression, unspecified: Secondary | ICD-10-CM | POA: Diagnosis not present

## 2021-12-17 DIAGNOSIS — Z6841 Body Mass Index (BMI) 40.0 and over, adult: Secondary | ICD-10-CM | POA: Diagnosis not present

## 2021-12-17 DIAGNOSIS — H2511 Age-related nuclear cataract, right eye: Secondary | ICD-10-CM | POA: Diagnosis not present

## 2021-12-17 DIAGNOSIS — Z950 Presence of cardiac pacemaker: Secondary | ICD-10-CM | POA: Insufficient documentation

## 2021-12-17 DIAGNOSIS — I129 Hypertensive chronic kidney disease with stage 1 through stage 4 chronic kidney disease, or unspecified chronic kidney disease: Secondary | ICD-10-CM | POA: Insufficient documentation

## 2021-12-17 HISTORY — PX: CATARACT EXTRACTION W/PHACO: SHX586

## 2021-12-17 LAB — GLUCOSE, CAPILLARY
Glucose-Capillary: 76 mg/dL (ref 70–99)
Glucose-Capillary: 86 mg/dL (ref 70–99)

## 2021-12-17 SURGERY — PHACOEMULSIFICATION, CATARACT, WITH IOL INSERTION
Anesthesia: Monitor Anesthesia Care | Site: Eye | Laterality: Right

## 2021-12-17 MED ORDER — DEXTROSE 50 % IV SOLN
25.0000 mL | Freq: Once | INTRAVENOUS | Status: AC
  Administered 2021-12-17: 25 mL via INTRAVENOUS

## 2021-12-17 MED ORDER — SIGHTPATH DOSE#1 BSS IO SOLN
INTRAOCULAR | Status: DC | PRN
  Administered 2021-12-17: 67 mL via OPHTHALMIC

## 2021-12-17 MED ORDER — SIGHTPATH DOSE#1 BSS IO SOLN
INTRAOCULAR | Status: DC | PRN
  Administered 2021-12-17: 1 mL via INTRAMUSCULAR

## 2021-12-17 MED ORDER — FENTANYL CITRATE (PF) 100 MCG/2ML IJ SOLN
INTRAMUSCULAR | Status: DC | PRN
  Administered 2021-12-17 (×2): 50 ug via INTRAVENOUS

## 2021-12-17 MED ORDER — MIDAZOLAM HCL 2 MG/2ML IJ SOLN
INTRAMUSCULAR | Status: DC | PRN
  Administered 2021-12-17: 2 mg via INTRAVENOUS

## 2021-12-17 MED ORDER — SIGHTPATH DOSE#1 BSS IO SOLN
INTRAOCULAR | Status: DC | PRN
  Administered 2021-12-17: 15 mL

## 2021-12-17 MED ORDER — BRIMONIDINE TARTRATE-TIMOLOL 0.2-0.5 % OP SOLN
OPHTHALMIC | Status: DC | PRN
  Administered 2021-12-17: 1 [drp] via OPHTHALMIC

## 2021-12-17 MED ORDER — MOXIFLOXACIN HCL 0.5 % OP SOLN
OPHTHALMIC | Status: DC | PRN
  Administered 2021-12-17: .2 mL via OPHTHALMIC

## 2021-12-17 MED ORDER — ONDANSETRON HCL 4 MG/2ML IJ SOLN: 4.0000 mg | Freq: Once | INTRAMUSCULAR | Status: DC | PRN

## 2021-12-17 MED ORDER — ERYTHROMYCIN 5 MG/GM OP OINT
TOPICAL_OINTMENT | OPHTHALMIC | Status: DC | PRN
  Administered 2021-12-17: 1 via OPHTHALMIC

## 2021-12-17 MED ORDER — LACTATED RINGERS IV SOLN: INTRAVENOUS | Status: DC

## 2021-12-17 MED ORDER — TETRACAINE HCL 0.5 % OP SOLN
1.0000 [drp] | OPHTHALMIC | Status: DC | PRN
  Administered 2021-12-17 (×3): 1 [drp] via OPHTHALMIC

## 2021-12-17 MED ORDER — ARMC OPHTHALMIC DILATING DROPS
1.0000 | OPHTHALMIC | Status: DC | PRN
  Administered 2021-12-17 (×3): 1 via OPHTHALMIC

## 2021-12-17 MED ORDER — SIGHTPATH DOSE#1 NA HYALUR & NA CHOND-NA HYALUR IO KIT
PACK | INTRAOCULAR | Status: DC | PRN
  Administered 2021-12-17: 1 via OPHTHALMIC

## 2021-12-17 SURGICAL SUPPLY — 11 items
CATARACT SUITE SIGHTPATH (MISCELLANEOUS) ×1 IMPLANT
FEE CATARACT SUITE SIGHTPATH (MISCELLANEOUS) ×1 IMPLANT
GLOVE SRG 8 PF TXTR STRL LF DI (GLOVE) ×1 IMPLANT
GLOVE SURG GAMMEX PI TX LF 7.5 (GLOVE) IMPLANT
GLOVE SURG UNDER POLY LF SZ8 (GLOVE) ×1
LENS CLAREON VIVITY TORIC 13.0 ×1 IMPLANT
LENS IOL CLRN VT TRC 3 13.0 IMPLANT
NDL FILTER BLUNT 18X1 1/2 (NEEDLE) ×1 IMPLANT
NEEDLE FILTER BLUNT 18X1 1/2 (NEEDLE) ×1 IMPLANT
SYR 3ML LL SCALE MARK (SYRINGE) ×1 IMPLANT
WATER STERILE IRR 250ML POUR (IV SOLUTION) ×1 IMPLANT

## 2021-12-17 NOTE — H&P (Signed)
[facility]   Primary Care Physician:  [place] Pink, MD Ophthalmologist: Dr. Leandrew Koyanagi  Pre-Procedure History & Physical: HPI:  Debbie Wiggins is a 71 y.o. female here for ophthalmic surgery.   Past Medical History:  Diagnosis Date   Bradycardia    Pacemaker placed in 2014   CKD (chronic kidney disease), stage III ([place])    Coronary artery disease    Depression    Diabetes mellitus without complication ([place])    Gout    Hypertension    Neuropathy    hands and feet   Presence of permanent cardiac pacemaker 2014   Venous stasis of both lower extremities     Past Surgical History:  Procedure Laterality Date   CATARACT EXTRACTION W/PHACO Left 12/03/2021   Procedure: CATARACT EXTRACTION PHACO AND INTRAOCULAR LENS PLACEMENT ([place]) LEFT DIABETIC  CLARION VIVITY TORIC LENS  4.74  00:45.9;  Surgeon: Leandrew Koyanagi, MD;  Location: [place];  Service: Ophthalmology;  Laterality: Left;  Diabetic   INSERT / REPLACE / REMOVE PACEMAKER  2014   NASAL SEPTUM SURGERY     in 1990s    Prior to Admission medications   Medication Sig Start Date End Date Taking? Authorizing Provider  acetaminophen (TYLENOL) 325 MG tablet Take 650 mg by mouth every 6 (six) hours as needed.   Yes [provider]  allopurinol (ZYLOPRIM) 100 MG tablet Take 100 mg by mouth daily. 09/18/20  Yes [provider]  aspirin 81 MG EC tablet Take by mouth.   Yes [provider]  carvedilol (COREG) 6.25 MG tablet Take 6.25 mg by mouth 2 (two) times daily with a meal.   Yes [provider]  cholecalciferol (VITAMIN D3) 25 MCG (1000 UNIT) tablet Take 1,000 Units by mouth daily.   Yes [provider]  dapagliflozin propanediol (FARXIGA) 10 MG TABS tablet Take 10 mg by mouth daily.   Yes [provider]  DULoxetine (CYMBALTA) 30 MG capsule  02/26/18  Yes [provider]  gabapentin (NEURONTIN) 100 MG capsule Take 100 mg by mouth 3 (three)  times daily as needed.   Yes [provider]  HUMULIN 70/30 (70-30) 100 UNIT/ML injection SMARTSIG:34 Unit(s) SUB-Q Twice Daily 12/02/18  Yes [provider]  loratadine (CLARITIN) 10 MG tablet Take by mouth.   Yes [provider]  mometasone (NASONEX) 50 MCG/ACT nasal spray Place 2 sprays into the nose daily.   Yes [provider]  rosuvastatin (CRESTOR) 10 MG tablet Take 10 mg by mouth daily.   Yes [provider]  sodium bicarbonate 650 MG tablet TK 1 T PO BID 02/02/18  Yes [provider]  timolol (TIMOPTIC) 0.5 % ophthalmic solution 1 drop every morning. 09/01/20  Yes [provider]  torsemide (DEMADEX) 10 MG tablet Take 10 mg by mouth 3 (three) times a week.   Yes [provider]  traZODone (DESYREL) 50 MG tablet Take 50 mg by mouth at bedtime. 07/18/20  Yes [provider]  valsartan (DIOVAN) 160 MG tablet Take 160 mg by mouth daily.   Yes [provider]  BD INSULIN SYRINGE U/F 31G X 5/16" 0.3 ML MISC 2 (two) times daily. 10/31/18   [provider]  Blood Glucose Monitoring Suppl (FIFTY50 GLUCOSE METER 2.0) w/Device KIT Use as directed (check blood glucose 3 times daily.) 11/16/17   [provider]  Blood Pressure Monitoring (BLOOD PRESSURE CUFF) MISC by Does not apply route. 03/19/20   [provider]  colchicine  0.6 MG tablet Take 0.6 mg by mouth daily as needed.    [provider]  Insulin Pen Needle (EXEL COMFORT POINT PEN NEEDLE) 29G X 12MM MISC Use as directed 11/16/17   [provider]  Lancets (ONETOUCH DELICA PLUS PQAESL75P) [place] USE  TID UTD 12/02/18   [provider]  latanoprost (XALATAN) 0.005 % ophthalmic solution Apply to eye. 05/13/17   [provider]  Roma Schanz test strip USE TID UTD 11/30/18   [provider]  pioglitazone (ACTOS) 15 MG tablet Take by mouth. 07/18/20 11/25/21  [provider]     Allergies as of 10/31/2021 - Review Complete 05/26/2021  Allergen Reaction Noted   Neosporin original [bacitracin-neomycin-polymyxin]  01/30/2021   Ceftin [cefuroxime axetil] Swelling and Rash 05/25/2017   Cefuroxime Other (See Comments), Rash, and Swelling 05/25/2017   Latex Swelling, Rash, and Other (See Comments) 05/25/2017   Nickel Rash 11/15/2017   Penicillins Swelling, Rash, and Other (See Comments) 05/25/2017   Sulfa antibiotics Swelling and Rash 05/25/2017    No family history on file.  Social History   Socioeconomic History   Marital status: Widowed    Spouse name: Not on file   Number of children: Not on file   Years of education: Not on file   Highest education level: Not on file  Occupational History   Not on file  Tobacco Use   Smoking status: Never   Smokeless tobacco: Never  Vaping Use   Vaping Use: Never used  Substance and Sexual Activity   Alcohol use: Never   Drug use: Never   Sexual activity: Not on file  Other Topics Concern   Not on file  Social History Narrative   Not on file   Social Determinants of Health   Financial Resource Strain: Not on file  Food Insecurity: Not on file  Transportation Needs: Not on file  Physical Activity: Not on file  Stress: Not on file  Social Connections: Not on file  Intimate Partner Violence: Not on file    Review of Systems: See HPI, otherwise negative ROS  Physical Exam: BP (!) 171/54   Temp 97.6 F (36.4 C) (Tympanic)   Ht 5' 4.02" (1.626 m)   Wt (!) 145.2 kg   SpO2 99%   BMI 54.90 kg/m  General:   Alert,  pleasant and cooperative in NAD Head:  Normocephalic and atraumatic. Lungs:  Clear to auscultation.    Heart:  Regular rate and rhythm.   Impression/Plan: Debbie Wiggins is here for ophthalmic surgery.  Risks, benefits, limitations, and alternatives regarding ophthalmic surgery have been reviewed with the patient.  Questions have been answered.  All parties  agreeable.   Leandrew Koyanagi, MD  12/17/2021, 10:00 AM

## 2021-12-17 NOTE — Transfer of Care (Signed)
Immediate Anesthesia Transfer of Care Note  Patient: Debbie Wiggins  Procedure(s) Performed: CATARACT EXTRACTION PHACO AND INTRAOCULAR LENS PLACEMENT (IOC) RIGHT DIABETIC CLARION VIVITY TORIC LENS  8.32  01:26.3 (Right: Eye)  Patient Location: PACU  Anesthesia Type: MAC  Level of Consciousness: awake, alert  and patient cooperative  Airway and Oxygen Therapy: Patient Spontanous Breathing and Patient connected to supplemental oxygen  Post-op Assessment: Post-op Vital signs reviewed, Patient's Cardiovascular Status Stable, Respiratory Function Stable, Patent Airway and No signs of Nausea or vomiting  Post-op Vital Signs: Reviewed and stable  Complications: No notable events documented.

## 2021-12-17 NOTE — Op Note (Signed)
  LOCATION:  [facility]   PREOPERATIVE DIAGNOSIS:  Nuclear sclerotic cataract of the right eye.  H25.11   POSTOPERATIVE DIAGNOSIS:  Nuclear sclerotic cataract of the right eye.   PROCEDURE:  Phacoemulsification with Toric posterior chamber intraocular lens placement of the right eye.  Ultrasound time: Procedure(s) with comments: CATARACT EXTRACTION PHACO AND INTRAOCULAR LENS PLACEMENT (IOC) RIGHT DIABETIC CLARION VIVITY TORIC LENS  8.32  01:26.3 (Right) - Diabetic  LENS:   Implant Name Type Inv. Item Serial No. Manufacturer Lot No. LRB No. Used Action  LENS CLAREON VIVITY TORIC 13.0 - [id number]  LENS CLAREON VIVITY TORIC 13.0 [id number] [employer]  Right 1 Implanted     [id number] 13.0 Toric intraocular lens with 1.5 diopters of cylindrical power with axis orientation at 169 degrees.   SURGEON:  Deirdre Evener, MD   ANESTHESIA: Topical with tetracaine drops and 2% Xylocaine jelly, augmented with 1% preservative-free intracameral lidocaine. .   COMPLICATIONS:  None.   DESCRIPTION OF PROCEDURE:  The patient was identified in the holding room and transported to the operating suite and placed in the supine position under the operating microscope.  The right eye was identified as the operative eye, and it was prepped and draped in the usual sterile ophthalmic fashion.    A clear-corneal paracentesis incision was made at the 12:00 position.  0.5 ml of preservative-free 1% lidocaine was injected into the anterior chamber. The anterior chamber was filled with Viscoat.  A 2.4 millimeter near clear corneal incision was then made at the 9:00 position.  A cystotome and capsulorrhexis forceps were then used to make a curvilinear capsulorrhexis.  Hydrodissection and hydrodelineation were then performed using balanced salt solution.   Phacoemulsification was then used in stop and chop fashion to remove the lens, nucleus and epinucleus.  The remaining cortex was aspirated using the  irrigation and aspiration handpiece.  Provisc viscoelastic was then placed into the capsular bag to distend it for lens placement.  The Verion digital marker was used to align the implant at the intended axis.   A Toric lens was then injected into the capsular bag.  It was rotated clockwise until the axis marks on the lens were approximately 15 degrees in the counterclockwise direction to the intended alignment.  The viscoelastic was aspirated from the eye using the irrigation aspiration handpiece.  Then, a Koch spatula through the sideport incision was used to rotate the lens in a clockwise direction until the axis markings of the intraocular lens were lined up with the Verion alignment.  Balanced salt solution was then used to hydrate the wounds. Vigamox 0.2 ml of a 1mg  per ml solution was injected into the anterior chamber for a dose of 0.2 mg of intracameral antibiotic at the completion of the case.    The eye was noted to have a physiologic pressure and there was no wound leak noted.   Timolol and Brimonidine drops and Erythromycin ointment were applied to the eye.  The patient was taken to the recovery room in stable condition having had no complications of anesthesia or surgery.  Zac Torti 12/17/2021, 10:48 AM

## 2021-12-17 NOTE — Anesthesia Postprocedure Evaluation (Signed)
Anesthesia Post Note  Patient: Debbie Wiggins  Procedure(s) Performed: CATARACT EXTRACTION PHACO AND INTRAOCULAR LENS PLACEMENT (IOC) RIGHT DIABETIC CLARION VIVITY TORIC LENS  8.32  01:26.3 (Right: Eye)  Patient location during evaluation: PACU Anesthesia Type: MAC Level of consciousness: awake and alert Pain management: pain level controlled Vital Signs Assessment: post-procedure vital signs reviewed and stable Respiratory status: spontaneous breathing, nonlabored ventilation, respiratory function stable and patient connected to nasal cannula oxygen Cardiovascular status: stable and blood pressure returned to baseline Postop Assessment: no apparent nausea or vomiting Anesthetic complications: no   No notable events documented.   Last Vitals:  Vitals:   12/17/21 1051 12/17/21 1053  BP: (!) 117/39 (!) 110/94  Pulse: 63 65  Resp: 15 15  Temp: (!) 36.1 C (!) 36.1 C  SpO2: 97% 97%    Last Pain:  Vitals:   12/17/21 1053  TempSrc:   PainSc: 0-No pain                 Arita Miss

## 2021-12-17 NOTE — Anesthesia Preprocedure Evaluation (Signed)
Anesthesia Evaluation  Patient identified by MRN, date of birth, ID band Patient awake    Reviewed: Allergy & Precautions, NPO status , Patient's Chart, lab work & pertinent test results  History of Anesthesia Complications Negative for: history of anesthetic complications  Airway Mallampati: III  TM Distance: >3 FB Neck ROM: full    Dental no notable dental hx. (+) Chipped, Teeth Intact   Pulmonary shortness of breath and with exertion   Pulmonary exam normal breath sounds clear to auscultation       Cardiovascular Exercise Tolerance: Poor hypertension, + CAD and + Peripheral Vascular Disease  Normal cardiovascular exam+ pacemaker  Rhythm:Regular Rate:Normal - Systolic murmurs    Neuro/Psych  PSYCHIATRIC DISORDERS  Depression     Neuromuscular disease    GI/Hepatic negative GI ROS, Neg liver ROS,,,  Endo/Other  diabetes, Insulin Dependent  Morbid obesity  Renal/GU Renal disease     Musculoskeletal negative musculoskeletal ROS (+)    Abdominal  (+) + obese  Peds  Hematology negative hematology ROS (+)   Anesthesia Other Findings Past Medical History: No date: Bradycardia     Comment:  Pacemaker placed in 2014 No date: CKD (chronic kidney disease), stage III (HCC) No date: Coronary artery disease No date: Depression No date: Diabetes mellitus without complication (HCC) No date: Gout No date: Hypertension No date: Neuropathy     Comment:  hands and feet 2014: Presence of permanent cardiac pacemaker No date: Venous stasis of both lower extremities  Past Surgical History: 2014: INSERT / REPLACE / REMOVE PACEMAKER No date: NASAL SEPTUM SURGERY     Comment:  in 56s  BMI    Body Mass Index: 54.07 kg/m      Reproductive/Obstetrics negative OB ROS                              Anesthesia Physical Anesthesia Plan  ASA: 4  Anesthesia Plan: MAC   Post-op Pain Management:  Minimal or no pain anticipated   Induction: Intravenous  PONV Risk Score and Plan: 2 and Midazolam  Airway Management Planned: Natural Airway and Nasal Cannula  Additional Equipment:   Intra-op Plan:   Post-operative Plan:   Informed Consent: I have reviewed the patients History and Physical, chart, labs and discussed the procedure including the risks, benefits and alternatives for the proposed anesthesia with the patient or authorized representative who has indicated his/her understanding and acceptance.     Dental Advisory Given  Plan Discussed with: Anesthesiologist, CRNA and Surgeon  Anesthesia Plan Comments: (Explained risks of anesthesia, including PONV, and rare emergencies such as cardiac events, respiratory problems, and allergic reactions, requiring invasive intervention. Discussed the role of CRNA in patient's perioperative care. Patient understands.  Patient informed about increased incidence of above perioperative risk due to high BMI. Patient understands.  )         Anesthesia Quick Evaluation

## 2021-12-18 ENCOUNTER — Encounter: Payer: Self-pay | Admitting: Ophthalmology

## 2022-01-01 ENCOUNTER — Encounter: Payer: Self-pay | Admitting: Podiatry

## 2022-01-01 ENCOUNTER — Ambulatory Visit: Payer: Medicare Other | Admitting: Podiatry

## 2022-01-01 DIAGNOSIS — B351 Tinea unguium: Secondary | ICD-10-CM

## 2022-01-01 DIAGNOSIS — M79674 Pain in right toe(s): Secondary | ICD-10-CM

## 2022-01-01 DIAGNOSIS — M79675 Pain in left toe(s): Secondary | ICD-10-CM

## 2022-01-01 DIAGNOSIS — E0843 Diabetes mellitus due to underlying condition with diabetic autonomic (poly)neuropathy: Secondary | ICD-10-CM | POA: Diagnosis not present

## 2022-01-01 NOTE — Progress Notes (Signed)
This patient returns to my office for at risk foot care.  This patient requires this care by a professional since this patient will be at risk due to having diabetes  This patient is unable to cut nails herself since the patient cannot reach her nails.These nails are painful walking and wearing shoes.  This patient presents for at risk foot care today.  General Appearance  Alert, conversant and in no acute stress.  Vascular  Dorsalis pedis and posterior tibial  pulses are weakly  palpable  due to swelling. bilaterally.  Capillary return is within normal limits  bilaterally. Temperature is within normal limits  bilaterally.  Neurologic  Senn-Weinstein monofilament wire test within normal limits  bilaterally. Muscle power within normal limits bilaterally.  Nails Thick disfigured discolored nails with subungual debris  from hallux to fifth toes bilaterally. No evidence of bacterial infection or drainage bilaterally.  Orthopedic  No limitations of motion  feet .  No crepitus or effusions noted.  No bony pathology or digital deformities noted.  HAV  B/L.  Skin  normotropic skin with no porokeratosis noted bilaterally.  No signs of infections or ulcers noted.     Onychomycosis  Pain in right toes  Pain in left toes  Consent was obtained for treatment procedures.   Mechanical debridement of nails 1-5  bilaterally performed with a nail nipper.  Filed with dremel without incident.    Return office visit    3 months                 Told patient to return for periodic foot care and evaluation due to potential at risk complications.   Helane Gunther DPM

## 2022-02-08 DIAGNOSIS — E161 Other hypoglycemia: Secondary | ICD-10-CM | POA: Diagnosis not present

## 2022-02-08 DIAGNOSIS — R001 Bradycardia, unspecified: Secondary | ICD-10-CM | POA: Diagnosis not present

## 2022-02-08 DIAGNOSIS — E162 Hypoglycemia, unspecified: Secondary | ICD-10-CM | POA: Diagnosis not present

## 2022-02-08 DIAGNOSIS — R42 Dizziness and giddiness: Secondary | ICD-10-CM | POA: Diagnosis not present

## 2022-04-09 ENCOUNTER — Ambulatory Visit: Payer: Medicare Other | Admitting: Podiatry

## 2022-04-20 ENCOUNTER — Ambulatory Visit: Payer: Medicare Other | Admitting: Podiatry

## 2022-05-04 ENCOUNTER — Ambulatory Visit: Payer: Medicare Other | Admitting: Podiatry

## 2022-06-03 ENCOUNTER — Ambulatory Visit: Payer: Medicare Other | Admitting: Podiatry

## 2022-06-17 ENCOUNTER — Ambulatory Visit: Payer: Medicare Other | Admitting: Podiatry

## 2022-06-24 ENCOUNTER — Ambulatory Visit: Payer: Medicare Other | Admitting: Podiatry

## 2022-06-24 DIAGNOSIS — M79673 Pain in unspecified foot: Secondary | ICD-10-CM | POA: Diagnosis not present

## 2022-06-24 DIAGNOSIS — B351 Tinea unguium: Secondary | ICD-10-CM

## 2022-06-24 DIAGNOSIS — E119 Type 2 diabetes mellitus without complications: Secondary | ICD-10-CM

## 2022-06-24 DIAGNOSIS — E0843 Diabetes mellitus due to underlying condition with diabetic autonomic (poly)neuropathy: Secondary | ICD-10-CM

## 2022-06-24 NOTE — Progress Notes (Signed)
  Subjective:  Patient ID: Debbie Wiggins, female    DOB: May 20, 1950,  MRN: [id number]  Chief Complaint  Patient presents with   Nail Problem    Thick painful toenails, 6 month follow up    72 y.o. female presents with the above complaint. History confirmed with patient.  Her diabetes is well-controlled.  Nails are thickened elongated causing discomfort, she had to reschedule her last appointment due to illness  Objective:  Physical Exam: warm, good capillary refill, no trophic changes or ulcerative lesions, normal DP and PT pulses, and normal sensory exam. Left Foot: dystrophic yellowed discolored nail plates with subungual debris Right Foot: dystrophic yellowed discolored nail plates with subungual debris   Assessment:   1. Pain due to onychomycosis of toenails of both feet   2. Encounter for diabetic foot exam (HCC)   3. Diabetes mellitus due to underlying condition with diabetic autonomic neuropathy, unspecified whether long term insulin use (HCC)      Plan:  Patient was evaluated and treated and all questions answered.  Patient educated on diabetes. Discussed proper diabetic foot care and discussed risks and complications of disease. Educated patient in depth on reasons to return to the office immediately should he/she discover anything concerning or new on the feet. All questions answered. Discussed proper shoes as well.  Annual diabetic foot examination performed today.  Discussed the etiology and treatment options for the condition in detail with the patient.  Recommended debridement of the nails today. Sharp and mechanical debridement performed of all painful and mycotic nails today. Nails debrided in length and thickness using a nail nipper to level of comfort. Discussed treatment options including appropriate shoe gear. Follow up as needed for painful nails.     Return in about 3 months (around 09/24/2022) for at risk diabetic foot care.

## 2022-10-01 ENCOUNTER — Ambulatory Visit (INDEPENDENT_AMBULATORY_CARE_PROVIDER_SITE_OTHER): Payer: Medicare Other | Admitting: Podiatry

## 2022-10-01 DIAGNOSIS — E0843 Diabetes mellitus due to underlying condition with diabetic autonomic (poly)neuropathy: Secondary | ICD-10-CM | POA: Diagnosis not present

## 2022-10-01 DIAGNOSIS — B351 Tinea unguium: Secondary | ICD-10-CM | POA: Diagnosis not present

## 2022-10-01 DIAGNOSIS — M79675 Pain in left toe(s): Secondary | ICD-10-CM | POA: Diagnosis not present

## 2022-10-01 DIAGNOSIS — M79674 Pain in right toe(s): Secondary | ICD-10-CM

## 2022-10-08 ENCOUNTER — Encounter: Payer: Self-pay | Admitting: Podiatry

## 2022-10-08 NOTE — Progress Notes (Signed)
Subjective:  Patient ID: Debbie Wiggins, female    DOB: [birth year],  MRN: [id number]  Debbie Wiggins presents to clinic today for at risk foot care with history of diabetic neuropathy and painful thick toenails that are difficult to trim. Pain interferes with ambulation. Aggravating factors include wearing enclosed shoe gear. Pain is relieved with periodic professional debridement. She has h/o venous stasis ulcers b/l. Chief Complaint  Patient presents with   Nail Problem    DFC,Referring Provider Jerl Mina, MD,lov:11/23,A1C:6.3 per patient,lov: 4/24,BS:125    New problem(s): None.   PCP is Jerl Mina, MD.  Allergies  Allergen Reactions   Neosporin Original [Bacitracin-Neomycin-Polymyxin]    Ceftin [Cefuroxime Axetil] Swelling and Rash   Cefuroxime Swelling and Rash   Latex Swelling and Rash    Bandaids  No issues with balloons, gloves or elastic in underwear   Nickel Rash   Penicillins Swelling and Rash   Sulfa Antibiotics Swelling and Rash   Tape Itching and Rash    Review of Systems: Negative except as noted in the HPI.  Objective: No changes noted in today's physical examination. There were no vitals filed for this visit. Debbie Wiggins is a pleasant 72 y.o. female morbidly obese in NAD. AAO x 3.  Vascular Examination: Capillary refill time immediate b/l. Vascular status intact b/l with palpable pedal pulses. Pedal hair sparse b/l. No pain with calf compression b/l. Skin temperature gradient WNL b/l. No cyanosis or clubbing b/l. No ischemia or gangrene noted b/l.  Wearing bilateral LE compression wraps. Evidence of chronic venous insufficiency b/l LE.  Neurological Examination: Sensation grossly intact b/l with 10 gram monofilament. Vibratory sensation intact b/l. Pt has subjective symptoms of neuropathy.  Dermatological Examination: Pedal skin with normal turgor, texture and tone b/l.  No open wounds. No interdigital macerations.   Toenails 1-5 b/l thick, discolored,  elongated with subungual debris and pain on dorsal palpation.   No interdigital macerations noted b/l LE. Toenails 1-5 b/l elongated, discolored, dystrophic, thickened, crumbly with subungual debris and tenderness to dorsal palpation. No hyperkeratotic nor porokeratotic lesions present on today's visit.  Musculoskeletal Examination: Muscle strength 5/5 to all lower extremity muscle groups bilaterally. No pain, crepitus or joint limitation noted with ROM bilateral LE. Pes planus deformity noted bilateral LE.  Radiographs: None  Assessment/Plan: 1. Pain due to onychomycosis of toenails of both feet   2. Diabetes mellitus due to underlying condition with diabetic autonomic neuropathy, unspecified whether long term insulin use (HCC)     Patient was evaluated and treated. All patient's and/or POA's questions/concerns addressed on today's visit. Toenails 1-5 debrided in length and girth without incident. Continue soft, supportive shoe gear daily. Report any pedal injuries to medical professional. Call office if there are any questions/concerns. -Continue foot and shoe inspections daily. Monitor blood glucose per PCP/Endocrinologist's recommendations. -Patient/POA to call should there be question/concern in the interim.   Return in about 3 months (around 12/31/2022).  Freddie Breech, DPM

## 2023-01-01 ENCOUNTER — Ambulatory Visit: Payer: Medicare Other | Admitting: Podiatry

## 2023-02-11 ENCOUNTER — Ambulatory Visit: Payer: Medicare Other | Admitting: Podiatry

## 2023-03-01 ENCOUNTER — Ambulatory Visit: Payer: Medicare Other | Admitting: Podiatry

## 2023-03-22 ENCOUNTER — Ambulatory Visit: Payer: Medicare Other | Admitting: Podiatry

## 2023-04-15 ENCOUNTER — Ambulatory Visit: Payer: Medicare Other | Admitting: Podiatry

## 2023-05-17 ENCOUNTER — Ambulatory Visit: Admitting: Podiatry

## 2023-05-24 IMAGING — US US EXTREM LOW VENOUS*L*
1 series · 13 of 24 positions shown · non-contrast
Comparison: None Available.

CLINICAL DATA: Left lower extremity edema and pain.



[Series 1: us extrem low venous*left* · 0.07mm/px · 13 of 33 slices shown]
[im 1/33]
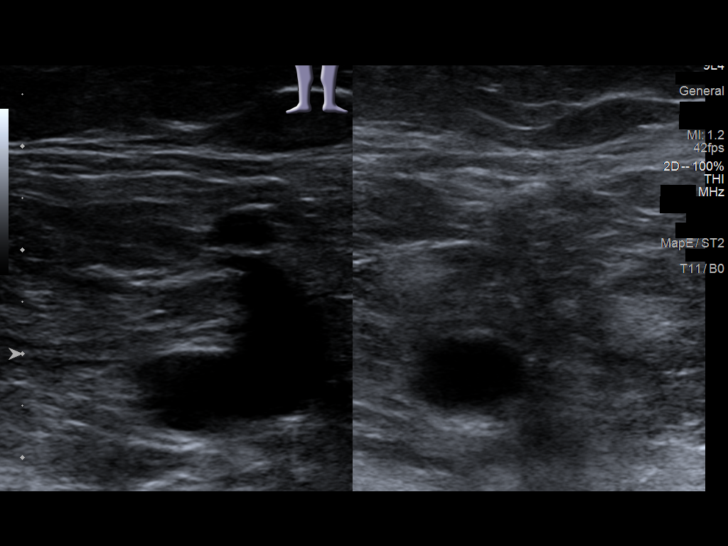
[im 3/33]
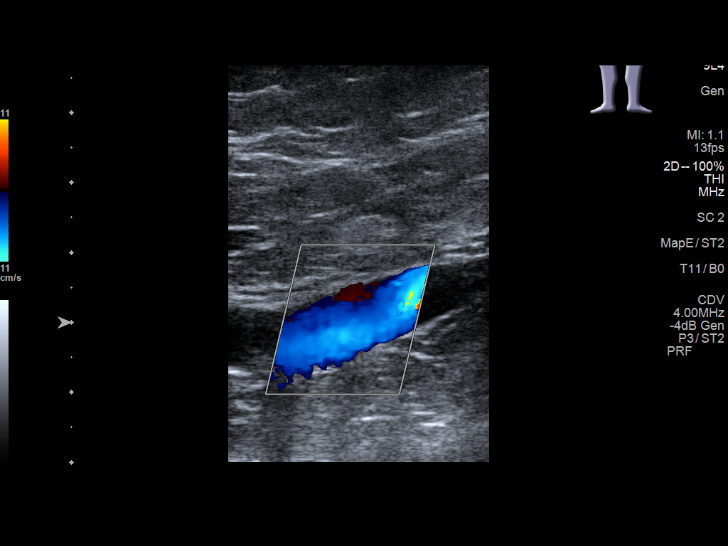
[im 6/33]
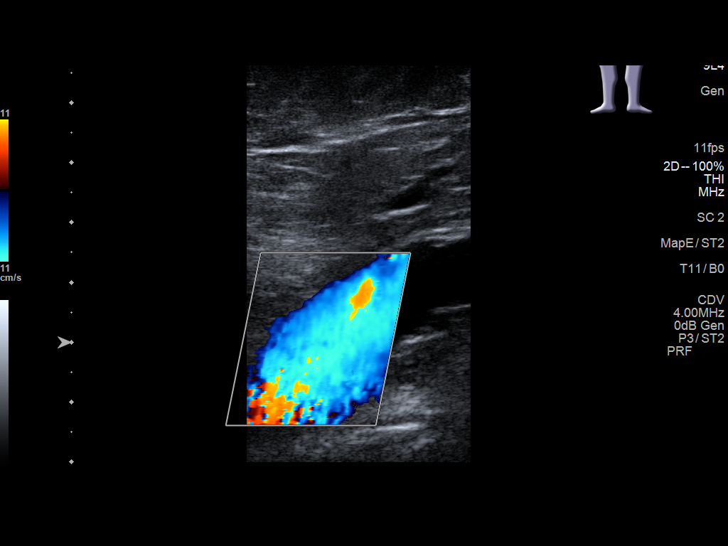
[im 9/33]
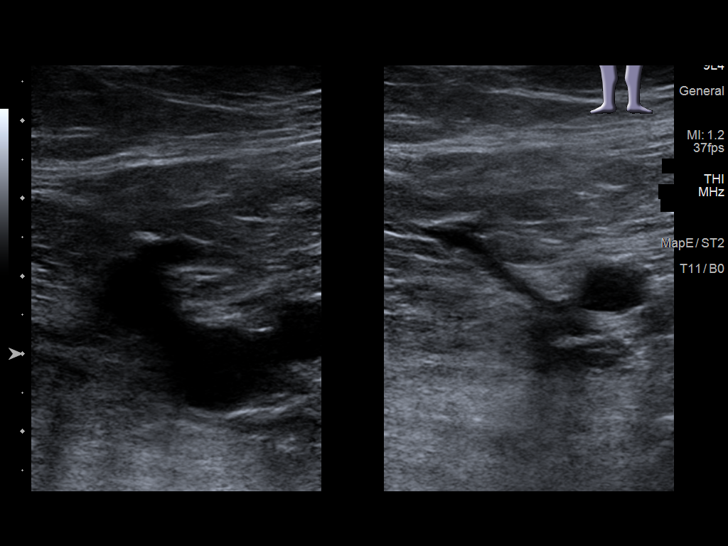
[im 12/33]
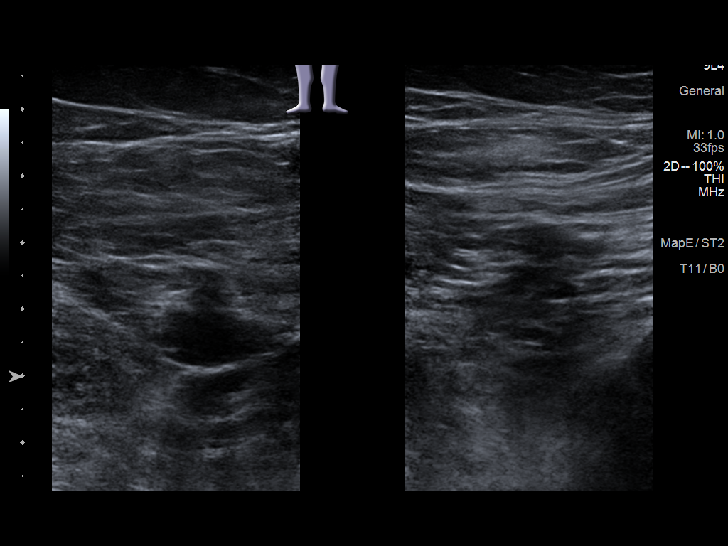
[im 14/33]
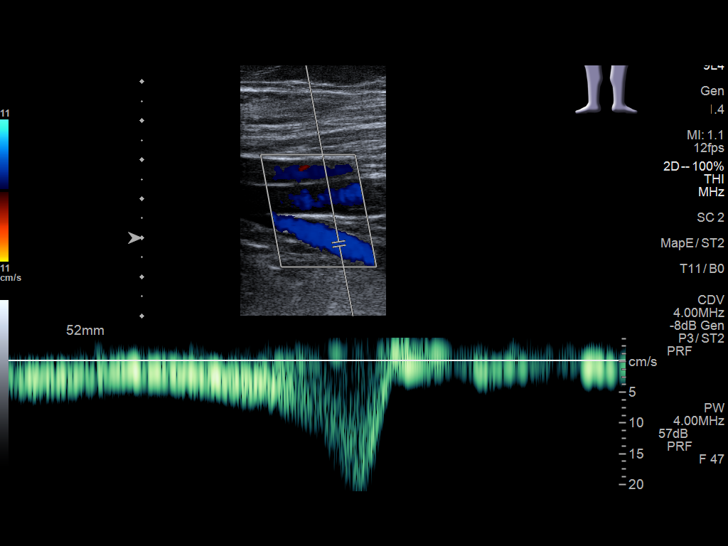
[im 17/33]
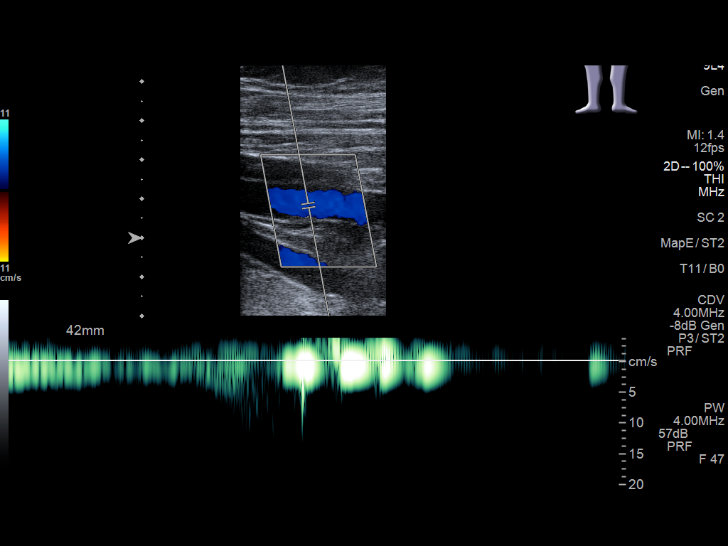
[im 19/33]
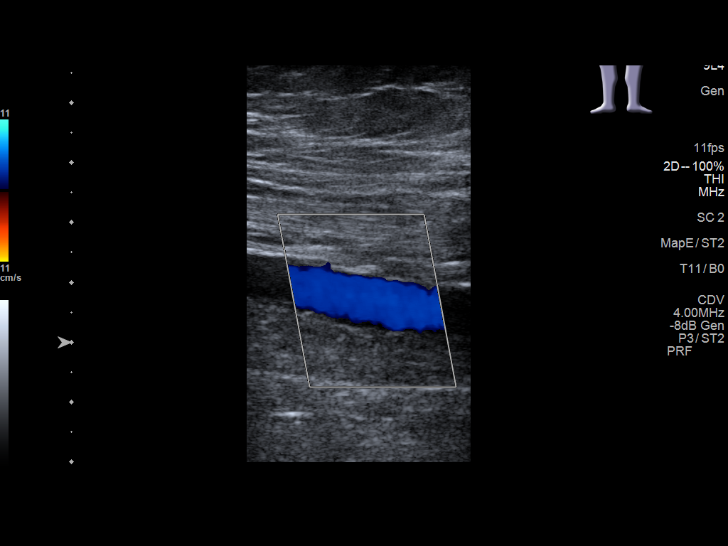
[im 21/33]
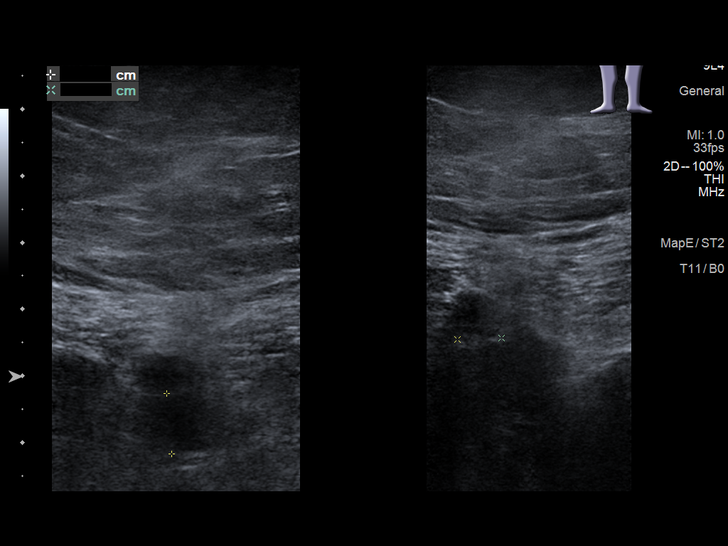
[im 24/33]
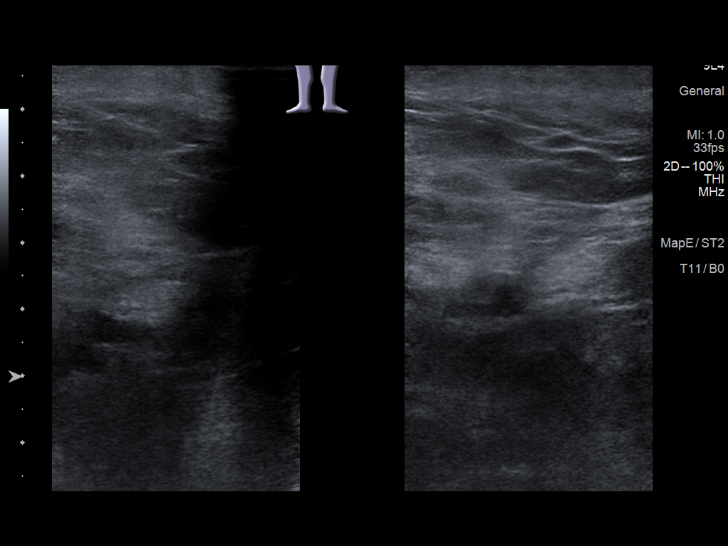
[im 27/33]
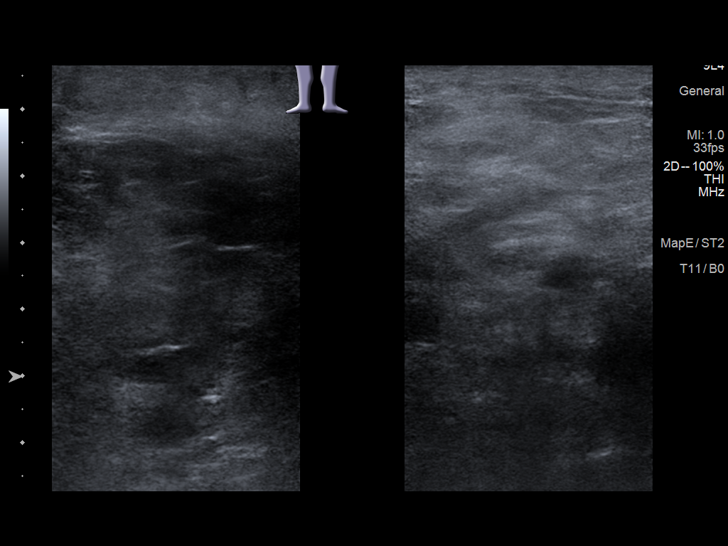
[im 30/33]
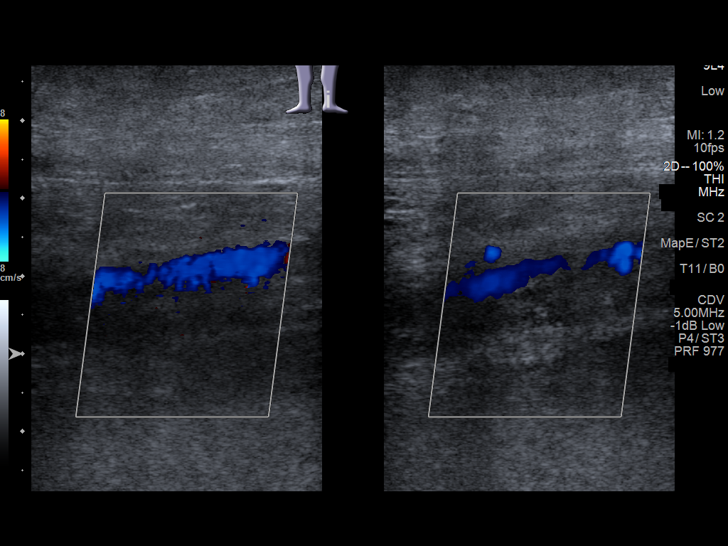
[im 33/33]
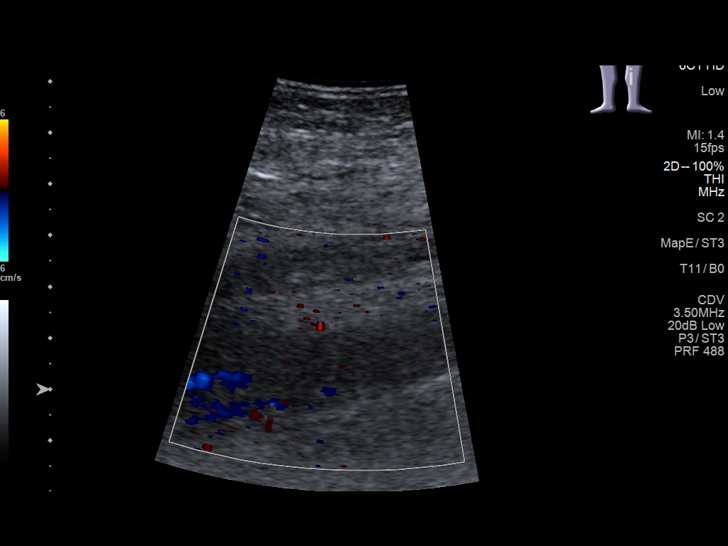

[13 of 24 positions shown; findings below may reference images not displayed]

FINDINGS: Contralateral Common Femoral Vein: Respiratory phasicity is normal
and symmetric with the symptomatic side. No evidence of thrombus.
Normal compressibility.

Common Femoral Vein: No evidence of thrombus. Normal
compressibility, respiratory phasicity and response to augmentation.

Saphenofemoral Junction: No evidence of thrombus. Normal
compressibility and flow on color Doppler imaging.

Profunda Femoral Vein: No evidence of thrombus. Normal
compressibility and flow on color Doppler imaging.

Femoral Vein: No evidence of thrombus. Normal compressibility,
respiratory phasicity and response to augmentation.

Popliteal Vein: No evidence of thrombus. Normal compressibility,
respiratory phasicity and response to augmentation.

Calf Veins: No evidence of thrombus. Normal compressibility and flow
on color Doppler imaging.

Superficial Great Saphenous Vein: No evidence of thrombus. Normal
compressibility.

Venous Reflux:  None.

Other Findings: No evidence of superficial thrombophlebitis or
abnormal fluid collection.
IMPRESSION: No evidence of left lower extremity deep venous thrombosis.

## 2023-06-10 ENCOUNTER — Ambulatory Visit: Admitting: Podiatry

## 2023-06-12 ENCOUNTER — Emergency Department (HOSPITAL_COMMUNITY)

## 2023-06-12 ENCOUNTER — Other Ambulatory Visit: Payer: Self-pay

## 2023-06-12 ENCOUNTER — Other Ambulatory Visit (HOSPITAL_COMMUNITY)

## 2023-06-12 ENCOUNTER — Inpatient Hospital Stay (HOSPITAL_COMMUNITY)
Admission: EM | Admit: 2023-06-12 | Discharge: 2023-06-21 | DRG: 602 | Disposition: A | Discharge status: Skilled Nursing Facility | Attending: Internal Medicine | Admitting: Internal Medicine

## 2023-06-12 ENCOUNTER — Encounter (HOSPITAL_COMMUNITY): Payer: Self-pay

## 2023-06-12 DIAGNOSIS — E669 Obesity, unspecified: Secondary | ICD-10-CM | POA: Diagnosis present

## 2023-06-12 DIAGNOSIS — G8929 Other chronic pain: Secondary | ICD-10-CM | POA: Diagnosis present

## 2023-06-12 DIAGNOSIS — L0390 Cellulitis, unspecified: Secondary | ICD-10-CM | POA: Diagnosis not present

## 2023-06-12 DIAGNOSIS — I129 Hypertensive chronic kidney disease with stage 1 through stage 4 chronic kidney disease, or unspecified chronic kidney disease: Secondary | ICD-10-CM | POA: Diagnosis present

## 2023-06-12 DIAGNOSIS — M19011 Primary osteoarthritis, right shoulder: Secondary | ICD-10-CM | POA: Diagnosis present

## 2023-06-12 DIAGNOSIS — L304 Erythema intertrigo: Secondary | ICD-10-CM | POA: Diagnosis present

## 2023-06-12 DIAGNOSIS — A419 Sepsis, unspecified organism: Secondary | ICD-10-CM

## 2023-06-12 DIAGNOSIS — Z6841 Body Mass Index (BMI) 40.0 and over, adult: Secondary | ICD-10-CM

## 2023-06-12 DIAGNOSIS — L89500 Pressure ulcer of unspecified ankle, unstageable: Secondary | ICD-10-CM | POA: Diagnosis not present

## 2023-06-12 DIAGNOSIS — Z91048 Other nonmedicinal substance allergy status: Secondary | ICD-10-CM

## 2023-06-12 DIAGNOSIS — N200 Calculus of kidney: Secondary | ICD-10-CM | POA: Diagnosis present

## 2023-06-12 DIAGNOSIS — W19XXXA Unspecified fall, initial encounter: Principal | ICD-10-CM

## 2023-06-12 DIAGNOSIS — Z794 Long term (current) use of insulin: Secondary | ICD-10-CM

## 2023-06-12 DIAGNOSIS — R748 Abnormal levels of other serum enzymes: Secondary | ICD-10-CM

## 2023-06-12 DIAGNOSIS — Y92009 Unspecified place in unspecified non-institutional (private) residence as the place of occurrence of the external cause: Secondary | ICD-10-CM | POA: Diagnosis not present

## 2023-06-12 DIAGNOSIS — R296 Repeated falls: Secondary | ICD-10-CM | POA: Diagnosis present

## 2023-06-12 DIAGNOSIS — L89159 Pressure ulcer of sacral region, unspecified stage: Secondary | ICD-10-CM | POA: Diagnosis present

## 2023-06-12 DIAGNOSIS — I872 Venous insufficiency (chronic) (peripheral): Secondary | ICD-10-CM | POA: Diagnosis present

## 2023-06-12 DIAGNOSIS — R81 Glycosuria: Secondary | ICD-10-CM | POA: Diagnosis present

## 2023-06-12 DIAGNOSIS — N390 Urinary tract infection, site not specified: Secondary | ICD-10-CM | POA: Diagnosis present

## 2023-06-12 DIAGNOSIS — N1832 Chronic kidney disease, stage 3b: Secondary | ICD-10-CM | POA: Diagnosis present

## 2023-06-12 DIAGNOSIS — E1122 Type 2 diabetes mellitus with diabetic chronic kidney disease: Secondary | ICD-10-CM | POA: Diagnosis present

## 2023-06-12 DIAGNOSIS — Z888 Allergy status to other drugs, medicaments and biological substances status: Secondary | ICD-10-CM

## 2023-06-12 DIAGNOSIS — E872 Acidosis, unspecified: Secondary | ICD-10-CM | POA: Diagnosis present

## 2023-06-12 DIAGNOSIS — R011 Cardiac murmur, unspecified: Secondary | ICD-10-CM | POA: Diagnosis present

## 2023-06-12 DIAGNOSIS — M109 Gout, unspecified: Secondary | ICD-10-CM | POA: Diagnosis present

## 2023-06-12 DIAGNOSIS — N289 Disorder of kidney and ureter, unspecified: Secondary | ICD-10-CM

## 2023-06-12 DIAGNOSIS — N3000 Acute cystitis without hematuria: Secondary | ICD-10-CM

## 2023-06-12 DIAGNOSIS — Z880 Allergy status to penicillin: Secondary | ICD-10-CM

## 2023-06-12 DIAGNOSIS — M7989 Other specified soft tissue disorders: Secondary | ICD-10-CM

## 2023-06-12 DIAGNOSIS — N281 Cyst of kidney, acquired: Secondary | ICD-10-CM | POA: Diagnosis present

## 2023-06-12 DIAGNOSIS — R9431 Abnormal electrocardiogram [ECG] [EKG]: Secondary | ICD-10-CM | POA: Diagnosis not present

## 2023-06-12 DIAGNOSIS — Z79899 Other long term (current) drug therapy: Secondary | ICD-10-CM

## 2023-06-12 DIAGNOSIS — R8271 Bacteriuria: Secondary | ICD-10-CM | POA: Diagnosis present

## 2023-06-12 DIAGNOSIS — Z950 Presence of cardiac pacemaker: Secondary | ICD-10-CM

## 2023-06-12 DIAGNOSIS — N2889 Other specified disorders of kidney and ureter: Secondary | ICD-10-CM | POA: Diagnosis present

## 2023-06-12 DIAGNOSIS — E876 Hypokalemia: Secondary | ICD-10-CM | POA: Diagnosis not present

## 2023-06-12 DIAGNOSIS — F5104 Psychophysiologic insomnia: Secondary | ICD-10-CM | POA: Diagnosis present

## 2023-06-12 DIAGNOSIS — Z7982 Long term (current) use of aspirin: Secondary | ICD-10-CM

## 2023-06-12 DIAGNOSIS — L03116 Cellulitis of left lower limb: Secondary | ICD-10-CM | POA: Diagnosis present

## 2023-06-12 DIAGNOSIS — R531 Weakness: Secondary | ICD-10-CM

## 2023-06-12 DIAGNOSIS — Z91040 Latex allergy status: Secondary | ICD-10-CM

## 2023-06-12 DIAGNOSIS — L89153 Pressure ulcer of sacral region, stage 3: Secondary | ICD-10-CM | POA: Diagnosis present

## 2023-06-12 DIAGNOSIS — I447 Left bundle-branch block, unspecified: Secondary | ICD-10-CM | POA: Diagnosis present

## 2023-06-12 DIAGNOSIS — I495 Sick sinus syndrome: Secondary | ICD-10-CM | POA: Diagnosis present

## 2023-06-12 DIAGNOSIS — Z882 Allergy status to sulfonamides status: Secondary | ICD-10-CM

## 2023-06-12 DIAGNOSIS — L98499 Non-pressure chronic ulcer of skin of other sites with unspecified severity: Secondary | ICD-10-CM | POA: Diagnosis present

## 2023-06-12 DIAGNOSIS — R5381 Other malaise: Secondary | ICD-10-CM | POA: Diagnosis present

## 2023-06-12 DIAGNOSIS — W1830XA Fall on same level, unspecified, initial encounter: Secondary | ICD-10-CM | POA: Diagnosis present

## 2023-06-12 DIAGNOSIS — M19012 Primary osteoarthritis, left shoulder: Secondary | ICD-10-CM | POA: Diagnosis present

## 2023-06-12 DIAGNOSIS — I2510 Atherosclerotic heart disease of native coronary artery without angina pectoris: Secondary | ICD-10-CM | POA: Diagnosis present

## 2023-06-12 DIAGNOSIS — E785 Hyperlipidemia, unspecified: Secondary | ICD-10-CM | POA: Diagnosis present

## 2023-06-12 DIAGNOSIS — Z7984 Long term (current) use of oral hypoglycemic drugs: Secondary | ICD-10-CM

## 2023-06-12 LAB — I-STAT CHEM 8, ED
BUN: 43 mg/dL — ABNORMAL HIGH (ref 8–23)
Calcium, Ion: 1.09 mmol/L — ABNORMAL LOW (ref 1.15–1.40)
Chloride: 109 mmol/L (ref 98–111)
Creatinine, Ser: 1.2 mg/dL — ABNORMAL HIGH (ref 0.44–1.00)
Glucose, Bld: 175 mg/dL — ABNORMAL HIGH (ref 70–99)
HCT: 48 % — ABNORMAL HIGH (ref 36.0–46.0)
Hemoglobin: 16.3 g/dL — ABNORMAL HIGH (ref 12.0–15.0)
Potassium: 4.1 mmol/L (ref 3.5–5.1)
Sodium: 143 mmol/L (ref 135–145)
TCO2: 22 mmol/L (ref 22–32)

## 2023-06-12 LAB — URINALYSIS, W/ REFLEX TO CULTURE (INFECTION SUSPECTED)
Bilirubin Urine: NEGATIVE
Glucose, UA: >=500 mg/dL — AB
Ketones, ur: 20 mg/dL — AB
Nitrite: NEGATIVE
Protein, ur: 30 mg/dL — AB
Specific Gravity, Urine: 1.017 (ref 1.005–1.030)
WBC, UA: >50 WBC/hpf (ref 0–5)
pH: 5 (ref 5.0–8.0)

## 2023-06-12 LAB — CBC WITH DIFFERENTIAL/PLATELET
Abs Immature Granulocytes: 0.12 10*3/uL — ABNORMAL HIGH (ref 0.00–0.07)
Basophils Absolute: 0 10*3/uL (ref 0.0–0.1)
Basophils Relative: 0 %
Eosinophils Absolute: 0 10*3/uL (ref 0.0–0.5)
Eosinophils Relative: 0 %
HCT: 43.2 % (ref 36.0–46.0)
Hemoglobin: 13.5 g/dL (ref 12.0–15.0)
Immature Granulocytes: 1 %
Lymphocytes Relative: 6 %
Lymphs Abs: 0.9 10*3/uL (ref 0.7–4.0)
MCH: 30.3 pg (ref 26.0–34.0)
MCHC: 31.3 g/dL (ref 30.0–36.0)
MCV: 97.1 fL (ref 80.0–100.0)
Monocytes Absolute: 1.4 10*3/uL — ABNORMAL HIGH (ref 0.1–1.0)
Monocytes Relative: 9 %
Neutro Abs: 13.2 10*3/uL — ABNORMAL HIGH (ref 1.7–7.7)
Neutrophils Relative %: 84 %
Platelets: 232 10*3/uL (ref 150–400)
RBC: 4.45 MIL/uL (ref 3.87–5.11)
RDW: 13.7 % (ref 11.5–15.5)
WBC: 15.7 10*3/uL — ABNORMAL HIGH (ref 4.0–10.5)
nRBC: 0 % (ref 0.0–0.2)

## 2023-06-12 LAB — COMPREHENSIVE METABOLIC PANEL WITH GFR
ALT: 31 U/L (ref 0–44)
AST: 39 U/L (ref 15–41)
Albumin: 2.9 g/dL — ABNORMAL LOW (ref 3.5–5.0)
Alkaline Phosphatase: 87 U/L (ref 38–126)
Anion gap: 20 — ABNORMAL HIGH (ref 5–15)
BUN: 43 mg/dL — ABNORMAL HIGH (ref 8–23)
CO2: 21 mmol/L — ABNORMAL LOW (ref 22–32)
Calcium: 9.7 mg/dL (ref 8.9–10.3)
Chloride: 104 mmol/L (ref 98–111)
Creatinine, Ser: 1.39 mg/dL — ABNORMAL HIGH (ref 0.44–1.00)
GFR, Estimated: 41 mL/min — ABNORMAL LOW (ref >60)
Glucose, Bld: 173 mg/dL — ABNORMAL HIGH (ref 70–99)
Potassium: 4.5 mmol/L (ref 3.5–5.1)
Sodium: 145 mmol/L (ref 135–145)
Total Bilirubin: 1.2 mg/dL (ref 0.0–1.2)
Total Protein: 6.9 g/dL (ref 6.5–8.1)

## 2023-06-12 LAB — CBG MONITORING, ED: Glucose-Capillary: 219 mg/dL — ABNORMAL HIGH (ref 70–99)

## 2023-06-12 LAB — HEMOGLOBIN A1C
Hgb A1c MFr Bld: 6.7 % — ABNORMAL HIGH (ref 4.8–5.6)
Mean Plasma Glucose: 145.59 mg/dL

## 2023-06-12 LAB — I-STAT CG4 LACTIC ACID, ED
Lactic Acid, Venous: 3.2 mmol/L (ref 0.5–1.9)
Lactic Acid, Venous: 1.7 mmol/L (ref 0.5–1.9)

## 2023-06-12 LAB — PROTIME-INR
INR: 1.1 (ref 0.8–1.2)
Prothrombin Time: 14.7 s (ref 11.4–15.2)

## 2023-06-12 LAB — CK: Total CK: 589 U/L — ABNORMAL HIGH (ref 38–234)

## 2023-06-12 MED ORDER — INSULIN ASPART 100 UNIT/ML IJ SOLN
0.0000 [IU] | Freq: Every day | INTRAMUSCULAR | Status: DC
  Administered 2023-06-12: 2 [IU] via SUBCUTANEOUS

## 2023-06-12 MED ORDER — ACETAMINOPHEN 650 MG RE SUPP
650.0000 mg | Freq: Four times a day (QID) | RECTAL | Status: DC | PRN
Start: 2023-06-12 — End: 2023-06-21

## 2023-06-12 MED ORDER — CARVEDILOL 6.25 MG PO TABS
6.2500 mg | ORAL_TABLET | Freq: Two times a day (BID) | ORAL | Status: DC
  Administered 2023-06-13 – 2023-06-16 (×7): 6.25 mg via ORAL
  Filled 2023-06-12 (×7): qty 1

## 2023-06-12 MED ORDER — ALLOPURINOL 100 MG PO TABS
100.0000 mg | ORAL_TABLET | Freq: Every day | ORAL | Status: DC
  Administered 2023-06-13 – 2023-06-21 (×9): 100 mg via ORAL
  Filled 2023-06-12 (×9): qty 1

## 2023-06-12 MED ORDER — INSULIN ASPART 100 UNIT/ML IJ SOLN
0.0000 [IU] | Freq: Three times a day (TID) | INTRAMUSCULAR | Status: DC
  Administered 2023-06-13: 3 [IU] via SUBCUTANEOUS
  Administered 2023-06-13: 2 [IU] via SUBCUTANEOUS
  Administered 2023-06-13 – 2023-06-14 (×2): 3 [IU] via SUBCUTANEOUS
  Administered 2023-06-14 (×2): 5 [IU] via SUBCUTANEOUS
  Administered 2023-06-15 – 2023-06-17 (×7): 3 [IU] via SUBCUTANEOUS
  Administered 2023-06-17: 2 [IU] via SUBCUTANEOUS
  Administered 2023-06-17 – 2023-06-18 (×2): 3 [IU] via SUBCUTANEOUS
  Administered 2023-06-18: 5 [IU] via SUBCUTANEOUS
  Administered 2023-06-18 – 2023-06-19 (×4): 3 [IU] via SUBCUTANEOUS
  Administered 2023-06-20: 2 [IU] via SUBCUTANEOUS
  Administered 2023-06-20: 5 [IU] via SUBCUTANEOUS
  Administered 2023-06-20: 3 [IU] via SUBCUTANEOUS
  Administered 2023-06-21: 5 [IU] via SUBCUTANEOUS
  Administered 2023-06-21: 3 [IU] via SUBCUTANEOUS

## 2023-06-12 MED ORDER — SODIUM CHLORIDE 0.9 % IV SOLN
2.0000 g | Freq: Once | INTRAVENOUS | Status: AC
  Administered 2023-06-12: 2 g via INTRAVENOUS
  Filled 2023-06-12: qty 10

## 2023-06-12 MED ORDER — ENOXAPARIN SODIUM 40 MG/0.4ML IJ SOSY
40.0000 mg | PREFILLED_SYRINGE | INTRAMUSCULAR | Status: DC
  Administered 2023-06-12 – 2023-06-20 (×9): 40 mg via SUBCUTANEOUS
  Filled 2023-06-12 (×9): qty 0.4

## 2023-06-12 MED ORDER — ENOXAPARIN SODIUM 60 MG/0.6ML IJ SOSY: 0.5000 mg/kg | PREFILLED_SYRINGE | INTRAMUSCULAR | Status: DC

## 2023-06-12 MED ORDER — SODIUM CHLORIDE 0.9 % IV BOLUS
500.0000 mL | Freq: Once | INTRAVENOUS | Status: AC
  Administered 2023-06-12: 500 mL via INTRAVENOUS

## 2023-06-12 MED ORDER — ALBUTEROL SULFATE (2.5 MG/3ML) 0.083% IN NEBU: 2.5000 mg | INHALATION_SOLUTION | RESPIRATORY_TRACT | Status: DC | PRN

## 2023-06-12 MED ORDER — TRAZODONE HCL 50 MG PO TABS
50.0000 mg | ORAL_TABLET | Freq: Every day | ORAL | Status: DC
Start: 2023-06-12 — End: 2023-06-21
  Administered 2023-06-12 – 2023-06-20 (×9): 50 mg via ORAL
  Filled 2023-06-12 (×9): qty 1

## 2023-06-12 MED ORDER — ROSUVASTATIN CALCIUM 5 MG PO TABS
10.0000 mg | ORAL_TABLET | Freq: Every day | ORAL | Status: DC
  Administered 2023-06-13 – 2023-06-21 (×9): 10 mg via ORAL
  Filled 2023-06-12 (×9): qty 2

## 2023-06-12 MED ORDER — VANCOMYCIN HCL IN DEXTROSE 1-5 GM/200ML-% IV SOLN
1000.0000 mg | Freq: Once | INTRAVENOUS | Status: AC
  Administered 2023-06-12: 1000 mg via INTRAVENOUS
  Filled 2023-06-12: qty 200

## 2023-06-12 MED ORDER — ACETAMINOPHEN 325 MG PO TABS
650.0000 mg | ORAL_TABLET | Freq: Four times a day (QID) | ORAL | Status: DC | PRN
  Administered 2023-06-14 – 2023-06-21 (×8): 650 mg via ORAL
  Filled 2023-06-12 (×8): qty 2

## 2023-06-12 MED ORDER — LACTATED RINGERS IV SOLN: INTRAVENOUS | Status: AC

## 2023-06-12 MED ORDER — SENNOSIDES-DOCUSATE SODIUM 8.6-50 MG PO TABS
1.0000 | ORAL_TABLET | Freq: Two times a day (BID) | ORAL | Status: DC
  Administered 2023-06-12 – 2023-06-13 (×3): 1 via ORAL
  Filled 2023-06-12 (×4): qty 1

## 2023-06-12 MED ORDER — DULOXETINE HCL 30 MG PO CPEP
30.0000 mg | ORAL_CAPSULE | Freq: Every day | ORAL | Status: DC
  Administered 2023-06-13 – 2023-06-21 (×9): 30 mg via ORAL
  Filled 2023-06-12 (×9): qty 1

## 2023-06-12 NOTE — Hospital Course (Addendum)
 Ground-level fall Physical deconditioning Presented after a mechanical fall at home and being found down for several days, unable to lift herself up due to generalized weakness.  She did not lose consciousness, denies any syncopal symptoms.  She reports general decline in functional status over several preceding weeks to months.  She had a CT head, CT abdomen pelvis, x-rays of both shoulders and chest, none of which demonstrated any acute traumatic injuries.  She worked with physical and occupational therapy while here, who recommended going to a skilled nursing facility for further rehabilitation.  We checked orthostatics, however they were inconclusive given patient inability to tolerate the activity.   Polypharmacy She was on several antihypertensive agents at home: Chlorthalidone, Farxiga , valsartan, and Coreg .  Her blood pressure has remained normotensive to borderline hypotensive throughout her hospital stay despite holding these medications.  In addition, she is on several antiglycemic medications: Farxiga , Humulin, and pioglitazone.  Her fasting blood sugars have remained near goal despite holding these medications during her hospital stay.  I suspect that her falls and general weakness may be exacerbated by this polypharmacy, she does not appear to need these antihypertensive and antiglycemic agents.  We will discontinue these at discharge and have her follow-up with her primary care doctor outpatient to reassess whether she should continue any of these medications.   Left leg cellulitis Venous insufficiency, with dermatitis and ulceration of L leg Intertrigo Pressure injuries She presented with erythematous, confluent skin changes in the left lower extremity concerning for left leg cellulitis.  She was treated with 7 days of linezolid .  She remained afebrile throughout her hospital stay.  She had a leukocytosis which improved during the course of her hospital stay.  The wound care team was  consulted for management of her leg wound in addition to her multiple pressure injuries and assisted with this during her hospital stay.  She will need to continue wound care in the skilled nursing facility, but has completed her antibiotics.  She was also found to have intertrigo in her abdominal folds, so was started on nystatin  powder for this, which she can continue at discharge.  She may benefit from compression stockings and other interventions to help her with her chronic venous insufficiency in her legs.  I suspect her pressure injuries will improve as she regains her mobility and strength through therapy and is no longer spending so much time laying on them.    Sick sinus syndrome Left bundle branch block Prolonged Qtc Bradycardia Her initial EKG on presentation showed possible left bundle branch block and prolonged Qtc.  She did not have any chest pain, palpitations, lightheadedness or dizziness on arrival to the hospital.  Given her symptoms, we do not have concerns for a cardiac event, however we did interrogate the pacemaker.  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  It also showed some AF/AT episodes and ventricular tachycardia, however these were reviewed by the electrophysiology team and were all remote episodes, with ventricular rates in the 60-80s. We provided her with education about the pacemaker and she is agreeable to follow up with her previous cardiologist, Dr. Parks Bollman.  She was also bradycardic on presentation, so we held her Coreg , and her heart rate has been within normal limits since then.  We do not suspect a cardiogenic etiology for her falls and weakness.  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  It also demonstrated a heterogenous debris collection in the left collecting system, renal cyst, and possible rounded solid masses in her kidneys.  She was asymptomatic from urinary standpoint and urinalysis was not concerning for any acute infection or obstructive stones.  Renal ultrasound was done which was limited by body habitus, however suggested that these masses may be cystic in nature, however it was not conclusive.  We spoke with urology, who reviewed the images and recommended no current intervention and outpatient follow-up with them in clinic.  The patient states that she has known about these stones for many years and occasionally has stones passes, but usually they pass easily with minimal pain.  She also follows regularly with nephrology outpatient.  Type 2 diabetes mellitus Hemoglobin A1c is 6.7%.  As mentioned above, she is on several antiglycemic agents, however her fasting sugars have remained near goal despite holding all of these medications.  We will continue Farxiga  at discharge, but will hold the insulin  and pioglitazone.  She can follow-up with her outpatient doctor about whether she would benefit from adding back any additional antiglycemic agents.  Hypertension As mentioned above, she is on several antihypertensive agents including carvedilol , valsartan, and chlorthalidone.  Her blood pressure has remained normotensive, and at times, slightly hypotensive throughout her hospital stay.  Therefore we held these medications and will continue to do so at discharge.  I suspect she may not need to take this many antihypertensive agents, however this will be at the discretion of her outpatient doctors.

## 2023-06-12 NOTE — ED Triage Notes (Signed)
 Pt BIB [facility] from home d/t a fall related to her generalized weakness that has been going on for days. Pt fell on thursday morning and has been on the floor since then. Pt landed on her left. No obvious injuries noted. Pt denied hitting her head. No LOC. Pt has multiple chronic wounds on her lower extremities and back. A&O X4. VSS.   140/70  HR 80

## 2023-06-12 NOTE — Progress Notes (Signed)
 VASCULAR LAB    Left lower extremity venous duplex has been performed.  See CV proc for preliminary results.   Brylie Sneath, RVT 06/12/2023, 5:45 PM

## 2023-06-12 NOTE — ED Notes (Signed)
 Phlebotomy attempting to collect repeat istat lactic and PT/INR. Unable to pull back from IV.   Pt provided PO fluids.   Pt sitting upright, Aox4, GCS 15.

## 2023-06-12 NOTE — ED Provider Notes (Signed)
 [place] EMERGENCY DEPARTMENT AT [facility] Provider Note   CSN: [id number] Arrival date & time: 06/12/23  1206     History {Add pertinent medical, surgical, social history, OB history to HPI:1} Chief Complaint  Patient presents with   Debbie Wiggins is a 73 y.o. female.  Patient is a 73 year old female with a history of diabetes, hypertension, chronic kidney disease, chronic venous stasis of the lower extremities who presents after a fall.  She states that Thursday morning, she was feeling a little weak.  She felt like her blood sugar was low.  She went to get out of bed and thought she could walk to get something to eat but had a fall due to the weakness.  She states she was unable to get up because her shoulders were hurting.  She has been laying on the floor since that time.  She said on Wednesday morning she had a little weakness also and felt like her blood sugar was low and she was having a hard time writing her name but she thinks that it was because she had not eaten and she said after she ate she was fine and she could write normally.  She denies any numbness or weakness in her extremities other than feeling weak all over.  She denies any vision changes or speech deficits.  No cough or cold symptoms.  No vomiting or diarrhea.  No fevers.       Home Medications Prior to Admission medications   Medication Sig Start Date End Date Taking? Authorizing Provider  acetaminophen (TYLENOL) 325 MG tablet Take 650 mg by mouth every 6 (six) hours as needed.    [provider]  allopurinol (ZYLOPRIM) 100 MG tablet Take 100 mg by mouth daily. 09/18/20   [provider]  aspirin 81 MG EC tablet Take by mouth.    [provider]  BD INSULIN  SYRINGE U/F 31G X 5/16" 0.3 ML MISC 2 (two) times daily. 10/31/18   [provider]  Blood Glucose Monitoring Suppl (FIFTY50 GLUCOSE METER 2.0) w/Device KIT Use as directed (check blood glucose 3 times  daily.) 11/16/17   [provider]  Blood Pressure Monitoring (BLOOD PRESSURE CUFF) MISC by Does not apply route. 03/19/20   [provider]  carvedilol (COREG) 6.25 MG tablet Take 6.25 mg by mouth 2 (two) times daily with a meal.    [provider]  cholecalciferol (VITAMIN D3) 25 MCG (1000 UNIT) tablet Take 1,000 Units by mouth daily.    [provider]  colchicine 0.6 MG tablet Take 0.6 mg by mouth daily as needed.    [provider]  dapagliflozin propanediol (FARXIGA) 10 MG TABS tablet Take 10 mg by mouth daily.    [provider]  DULoxetine (CYMBALTA) 30 MG capsule  02/26/18   [provider]  gabapentin (NEURONTIN) 100 MG capsule Take 100 mg by mouth 3 (three) times daily as needed.    [provider]  HUMULIN 70/30 (70-30) 100 UNIT/ML injection SMARTSIG:34 Unit(s) SUB-Q Twice Daily 12/02/18   [provider]  Insulin  Pen Needle (EXEL COMFORT POINT PEN NEEDLE) 29G X MISC Use as directed 11/16/17   [provider]  Lancets (ONETOUCH DELICA PLUS LANCET30G) MISC USE  TID UTD 12/02/18   [provider]  latanoprost (XALATAN) 0.005 % ophthalmic solution Apply to eye. 05/13/17   [provider]  loratadine (CLARITIN) 10 MG tablet Take by mouth.    [provider]  mometasone (NASONEX) 50 MCG/ACT nasal spray Place 2 sprays into the nose daily.    [provider]  Alisia Irons test strip USE TID UTD 11/30/18   [provider]  pioglitazone (ACTOS) 15 MG tablet Take by mouth. 07/18/20 11/25/21  [provider]  rosuvastatin (CRESTOR) 10 MG tablet Take 10 mg by mouth daily.    [provider]  sodium bicarbonate 650 MG tablet TK 1 T PO BID 02/02/18   [provider]  timolol  (TIMOPTIC ) 0.5 % ophthalmic solution 1 drop every morning. 09/01/20   [provider]  torsemide (DEMADEX) 10 MG tablet Take 10 mg by mouth 3 (three) times a  week.    [provider]  traZODone (DESYREL) 50 MG tablet Take 50 mg by mouth at bedtime. 07/18/20   [provider]  valsartan (DIOVAN) 160 MG tablet Take 160 mg by mouth daily.    [provider]      Allergies    Neosporin original [bacitracin-neomycin-polymyxin], Ceftin [cefuroxime axetil], Cefuroxime, Latex, Nickel, Penicillins, Sulfa antibiotics, and Tape    Review of Systems   Review of Systems  Constitutional:  Negative for chills, diaphoresis, fatigue and fever.  HENT:  Negative for congestion, rhinorrhea and sneezing.   Eyes: Negative.   Respiratory:  Negative for cough, chest tightness and shortness of breath.   Cardiovascular:  Negative for chest pain and leg swelling.  Gastrointestinal:  Negative for abdominal pain, blood in stool, diarrhea, nausea and vomiting.  Genitourinary:  Negative for difficulty urinating, flank pain, frequency and hematuria.  Musculoskeletal:  Negative for arthralgias and back pain.  Skin:  Positive for wound. Negative for rash.  Neurological:  Positive for weakness. Negative for dizziness, speech difficulty, numbness and headaches.    Physical Exam Updated Vital Signs BP (!) 147/67 (BP Location: Right Wrist)   Pulse 81   Temp 97.6 F (36.4 C) (Oral)   Resp 17   Ht 5\' 4"  (1.626 m)   Wt 117.9 kg   SpO2 96%   BMI 44.63 kg/m  Physical Exam Constitutional:      Appearance: She is well-developed.  HENT:     Head: Normocephalic and atraumatic.  Eyes:     Pupils: Pupils are equal, round, and reactive to light.  Cardiovascular:     Rate and Rhythm: Normal rate and regular rhythm.     Heart sounds: Normal heart sounds.  Pulmonary:     Effort: Pulmonary effort is normal. No respiratory distress.     Breath sounds: Normal breath sounds. No wheezing or rales.  Chest:     Chest wall: No tenderness.  Abdominal:     General: Bowel sounds are normal.     Palpations: Abdomen is soft.     Tenderness: There is no  abdominal tenderness. There is no guarding or rebound.     Comments: There is a 2 cm eschar to the right hip area.  There is no drainage or signs of infection.  There is a larger wound to the right flank that appears to be deeper  Musculoskeletal:        General: Normal range of motion.     Cervical back: Normal range of motion and neck supple.     Comments: 1+ edema to the right lower leg, 2+ edema to the left lower leg with redness to the pretibial area and circumferentially around the left lower leg.  There is some peeling on both the medial and lateral aspect of  the leg.  It is weeping clear fluid.  Pedal pulses are intact.  Lymphadenopathy:     Cervical: No cervical adenopathy.  Skin:    General: Skin is warm and dry.     Findings: No rash.  Neurological:     General: No focal deficit present.     Mental Status: She is alert and oriented to person, place, and time.        ED Results / Procedures / Treatments   Labs (all labs ordered are listed, but only abnormal results are displayed) Labs Reviewed  COMPREHENSIVE METABOLIC PANEL WITH GFR - Abnormal; Notable for the following components:      Result Value   CO2 21 (*)    Glucose, Bld 173 (*)    BUN 43 (*)    Creatinine, Ser 1.39 (*)    Albumin 2.9 (*)    GFR, Estimated 41 (*)    Anion gap 20 (*)    All other components within normal limits  URINALYSIS, W/ REFLEX TO CULTURE (INFECTION SUSPECTED) - Abnormal; Notable for the following components:   Color, Urine AMBER (*)    APPearance CLOUDY (*)    Glucose, UA >=500 (*)    Hgb urine dipstick SMALL (*)    Ketones, ur 20 (*)    Protein, ur 30 (*)    Leukocytes,Ua LARGE (*)    Bacteria, UA MANY (*)    All other components within normal limits  CK - Abnormal; Notable for the following components:   Total CK 589 (*)    All other components within normal limits  CBC WITH DIFFERENTIAL/PLATELET - Abnormal; Notable for the following components:   WBC 15.7 (*)    Neutro Abs  13.2 (*)    Monocytes Absolute 1.4 (*)    Abs Immature Granulocytes 0.12 (*)    All other components within normal limits  I-STAT CG4 LACTIC ACID, ED - Abnormal; Notable for the following components:   Lactic Acid, Venous 3.2 (*)    All other components within normal limits  I-STAT CHEM 8, ED - Abnormal; Notable for the following components:   BUN 43 (*)    Creatinine, Ser 1.20 (*)    Glucose, Bld 175 (*)    Calcium, Ion 1.09 (*)    Hemoglobin 16.3 (*)    HCT 48.0 (*)    All other components within normal limits  CULTURE, BLOOD (ROUTINE X 2)  CULTURE, BLOOD (ROUTINE X 2)  URINE CULTURE  CBC WITH DIFFERENTIAL/PLATELET  PROTIME-INR  MAGNESIUM  PHOSPHORUS  CBC  BASIC METABOLIC PANEL WITH GFR  HEMOGLOBIN A1C  LACTIC ACID, PLASMA  LACTIC ACID, PLASMA  I-STAT CG4 LACTIC ACID, ED    EKG EKG Interpretation Date/Time:  Saturday Jun 12 2023 12:52:00 EDT Ventricular Rate:  78 PR Interval:  171 QRS Duration:  144 QT Interval:  453 QTC Calculation: 516 R Axis:   -43  Text Interpretation: Sinus rhythm Left bundle branch block No old tracing to compare Confirmed by Trish Furl [phone]) on 06/12/2023 1:29:56 PM  Radiology VAS US  LOWER EXTREMITY VENOUS (DVT) (7a-7p) Result Date: 06/12/2023  Lower Venous DVT Study Patient Name:  Debbie Wiggins  Date of Exam:   06/12/2023 Medical Rec #: [id number]   Accession #:    [id number] Date of Birth: [birth year]    Patient Gender: F Patient Age:   52 years Exam Location:  [facility] Procedure:      VAS US  LOWER EXTREMITY VENOUS (DVT) Referring Phys: Sherrod Toothman --------------------------------------------------------------------------------  Indications: Patient fell onto left side 06/10/23 and was in floor until found down today. Multiple open and weeping wounds on legs, elevated CK  Limitations: Body habitus and Weeping, open wounds on calf. Comparison Study: Prior negative left LEV done 05/21/2021 at [facility] Performing Technologist: Carleene Chase RVS  Examination Guidelines: A complete evaluation includes B-mode imaging, spectral Doppler, color Doppler, and power Doppler as needed of all accessible portions of each vessel. Bilateral testing is considered an integral part of a complete examination. Limited examinations for reoccurring indications may be performed as noted. The reflux portion of the exam is performed with the patient in reverse Trendelenburg.  +-----+---------------+---------+-----------+----------+--------------+ RIGHTCompressibilityPhasicitySpontaneityPropertiesThrombus Aging +-----+---------------+---------+-----------+----------+--------------+ CFV  Full           Yes      Yes                                 +-----+---------------+---------+-----------+----------+--------------+ SFJ  Full                                                        +-----+---------------+---------+-----------+----------+--------------+   +---------+---------------+---------+-----------+----------+-------------------+ LEFT     CompressibilityPhasicitySpontaneityPropertiesThrombus Aging      +---------+---------------+---------+-----------+----------+-------------------+ CFV      Full           Yes      Yes                                      +---------+---------------+---------+-----------+----------+-------------------+ SFJ      Full                                                             +---------+---------------+---------+-----------+----------+-------------------+ FV Prox  Full           Yes      Yes                                      +---------+---------------+---------+-----------+----------+-------------------+ FV Mid   Full                                                             +---------+---------------+---------+-----------+----------+-------------------+ FV DistalFull                                                              +---------+---------------+---------+-----------+----------+-------------------+ PFV      Full           Yes      Yes                                      +---------+---------------+---------+-----------+----------+-------------------+  POP      Full           Yes      Yes                                      +---------+---------------+---------+-----------+----------+-------------------+ PTV      Full                                         Visualization of                                                          proximal portion                                                          only                +---------+---------------+---------+-----------+----------+-------------------+ PERO     Full                                         Visualization of                                                          proximal portion                                                          only                +---------+---------------+---------+-----------+----------+-------------------+    Summary: RIGHT: - No evidence of common femoral vein obstruction.   LEFT: - There is no evidence of deep vein thrombosis in the visualized veins of the left lower extremity.  - No cystic structure found in the popliteal fossa.  *See table(s) above for measurements and observations.    Preliminary    CT Head Wo Contrast Result Date: 06/12/2023 CLINICAL DATA:  Debbie Wiggins, head trauma EXAM: CT HEAD WITHOUT CONTRAST TECHNIQUE: Contiguous axial images were obtained from the base of the skull through the vertex without intravenous contrast. RADIATION DOSE REDUCTION: This exam was performed according to the departmental dose-optimization program which includes automated exposure control, adjustment of the mA and/or kV according to patient size and/or use of iterative reconstruction technique. COMPARISON:  None Available. FINDINGS: Brain: No acute infarct or hemorrhage. Lateral ventricles  and midline structures are unremarkable. No acute extra-axial fluid collections. No mass effect. Vascular: No hyperdense vessel or unexpected calcification. Skull: Left temporal scalp edema.  No underlying fracture. Sinuses/Orbits: Opacification of  the left maxillary sinus with thickening of the bony margins consistent with chronic sinus disease. Mild mucosal thickening within the ethmoid air cells. Other: None. IMPRESSION: 1. No acute intracranial process. 2. Left temporal scalp edema.  No underlying fracture. Electronically Signed   By: Bobbye Burrow M.D.   On: 06/12/2023 16:51   CT ABDOMEN PELVIS WO CONTRAST Result Date: 06/12/2023 CLINICAL DATA:  Debbie Wiggins. Multiple chronic wounds on her lower extremities and back. Generalized weakness. EXAM: CT ABDOMEN AND PELVIS WITHOUT CONTRAST TECHNIQUE: Multidetector CT imaging of the abdomen and pelvis was performed following the standard protocol without IV contrast. RADIATION DOSE REDUCTION: This exam was performed according to the departmental dose-optimization program which includes automated exposure control, adjustment of the mA and/or kV according to patient size and/or use of iterative reconstruction technique. COMPARISON:  None Available. FINDINGS: Lower chest: Intracardiac pacer leads. Borderline enlarged heart. Minimal left basilar atelectasis. Hepatobiliary: Possible noncalcified gallstones in the dependent portion of the gallbladder. No gallbladder wall thickening or pericholecystic fluid. Normal-appearing liver. Pancreas: Unremarkable. No pancreatic ductal dilatation or surrounding inflammatory changes. Spleen: Normal in size without focal abnormality. Adrenals/Urinary Tract: Normal-appearing adrenal gland. Large number of calculi in the left renal collecting system with mild-to-moderate dilatation of the collecting system. The collecting system is filled with medium density material measuring 39 Hounsfield units in density. 1 cm rounded, mildly heterogeneous,  high density mass in the medial right kidney. 2 1.4 cm partially exophytic, possible rounded solid masses in the lateral left kidney. Unremarkable ureters and urinary bladder. Stomach/Bowel: Knuckle of transverse colon in a moderate-sized umbilical hernia without wall thickening or obstruction. Normal-appearing stomach, small bowel and appendix. Vascular/Lymphatic: No significant vascular findings are present. No enlarged abdominal or pelvic lymph nodes. Reproductive: Enlarged, mildly irregular uterus.  No adnexal masses. Other: Moderate-sized umbilical hernia containing herniated fat and a knuckle of herniated transverse colon without obstruction. Musculoskeletal: Extensive lumbar and lower thoracic spine degenerative changes. IMPRESSION: 1. Large number of calculi in the left renal collecting system with mild-to-moderate dilatation of the collecting system. The collecting system is filled with medium density material measuring 39 Hounsfield units in density. This could represent blood products, proteinaceous debris or products of chronic infection. 2. 1 cm rounded, mildly heterogeneous, high density mass in the medial right kidney. This could represent a hemorrhagic or proteinaceous cyst. A solid mass is not excluded. A renal ultrasound is recommended for further evaluation. 3. 2 1.4 cm partially exophytic, possible rounded solid masses in the lateral left kidney. These can also be evaluated with a renal ultrasound. 4. Possible noncalcified gallstones in the dependent portion of the gallbladder. 5. Moderate-sized umbilical hernia containing herniated fat and a knuckle of herniated transverse colon without obstruction. 6. Enlarged, mildly irregular uterus, likely due to fibroids. Electronically Signed   By: Catherin Closs M.D.   On: 06/12/2023 16:43   DG Shoulder Right Result Date: 06/12/2023 CLINICAL DATA:  Fall, shoulder pain EXAM: RIGHT SHOULDER - 2+ VIEW COMPARISON:  None Available. FINDINGS: Degenerative  glenohumeral arthropathy with loss of articular space and spurring although less striking than on the left. Mild degenerative AC joint spurring. No fracture or malalignment. Suboptimal transscapular projection due to patient body habitus. IMPRESSION: 1. 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 No fracture or malalignment is identified. Electronically Signed   By: Freida Jes M.D.   On: 06/12/2023 14:08   DG Shoulder Left Result Date: 06/12/2023 CLINICAL DATA:  Fall, bilateral shoulder pain EXAM: LEFT SHOULDER - 2+ VIEW COMPARISON:  None  Available. FINDINGS: Severe degenerative glenohumeral arthropathy with prominent spurring noted. Mild degenerative AC joint spurring. An ECG snap per projects directly over the left humeral head and glenohumeral joint which is suboptimal, and body habitus also reduces diagnostic sensitivity and specificity. However, I do not see a definite acute fracture or malalignment. Dual lead pacer noted. IMPRESSION: 1. Severe degenerative glenohumeral arthropathy/spurring. 2. Mild degenerative AC joint spurring. 3. No definite acute fracture or malalignment. Electronically Signed   By: Freida Jes M.D.   On: 06/12/2023 14:07   DG Chest Port 1 View Result Date: 06/12/2023 CLINICAL DATA:  Questionable sepsis - evaluate for abnormality EXAM: PORTABLE CHEST 1 VIEW COMPARISON:  None Available. FINDINGS: Evaluation is limited by rotation. The cardiomediastinal silhouette is mildly enlarged in contour.LEFT chest cardiac pacing device. No pleural effusion. No pneumothorax. No acute pleuroparenchymal abnormality. Subcortical sclerosis of the LEFT glenohumeral joint with joint space narrowing and osteophyte formation most consistent with degenerative changes. IMPRESSION: No acute cardiopulmonary abnormality. Electronically Signed   By: Clancy Crimes M.D.   On: 06/12/2023 13:16    Procedures Procedures  {Document cardiac monitor, telemetry assessment  procedure when appropriate:1}  Medications Ordered in ED Medications  lactated ringers  infusion (0 mLs Intravenous Paused 06/12/23 1935)  allopurinol (ZYLOPRIM) tablet 100 mg (has no administration in time range)  carvedilol (COREG) tablet 6.25 mg (has no administration in time range)  rosuvastatin (CRESTOR) tablet 10 mg (has no administration in time range)  DULoxetine (CYMBALTA) DR capsule 30 mg (has no administration in time range)  traZODone (DESYREL) tablet 50 mg (has no administration in time range)  acetaminophen (TYLENOL) tablet 650 mg (has no administration in time range)    Or  acetaminophen (TYLENOL) suppository 650 mg (has no administration in time range)  senna-docusate (Senokot-S) tablet 1 tablet (has no administration in time range)  albuterol (PROVENTIL) (2.5 MG/3ML) 0.083% nebulizer solution 2.5 mg (has no administration in time range)  insulin  aspart (novoLOG ) injection 0-5 Units (has no administration in time range)  insulin  aspart (novoLOG ) injection 0-15 Units (has no administration in time range)  sodium chloride  0.9 % bolus 500 mL (0 mLs Intravenous Stopped 06/12/23 1809)  aztreonam (AZACTAM) 2 g in sodium chloride  0.9 % 100 mL IVPB (0 g Intravenous Stopped 06/12/23 1809)  vancomycin (VANCOCIN) IVPB 1000 mg/200 mL premix (0 mg Intravenous Stopped 06/12/23 1648)    ED Course/ Medical Decision Making/ A&P   {   Click here for ABCD2, HEART and other calculatorsREFRESH Note before signing :1}                              Medical Decision Making Amount and/or Complexity of Data Reviewed Labs: ordered. Radiology: ordered.  Risk Prescription drug management. Decision regarding hospitalization.   ***  {Document critical care time when appropriate:1} {Document review of labs and clinical decision tools ie heart score, Chads2Vasc2 etc:1}  {Document your independent review of radiology images, and any outside records:1} {Document your discussion with family members,  caretakers, and with consultants:1} {Document social determinants of health affecting pt's care:1} {Document your decision making why or why not admission, treatments were needed:1} Final Clinical Impression(s) / ED Diagnoses Final diagnoses:  Fall, initial encounter  Sepsis, due to unspecified organism, unspecified whether acute organ dysfunction present (HCC)  Acute cystitis without hematuria  Cellulitis, unspecified cellulitis site  Renal mass    Rx / DC Orders ED Discharge Orders     None

## 2023-06-12 NOTE — ED Notes (Addendum)
 MD Meredeth Stallion at bedside; verbal order for PICC line - awaiting IV Team.

## 2023-06-12 NOTE — H&P (Cosign Needed Addendum)
 Date: 06/12/2023               Patient Name:  Debbie Wiggins MRN: [id number]  DOB: 07-02-1950 Age / Sex: 73 y.o., female   PCP: Lyle San, MD         Medical Service: Internal Medicine Teaching Service         Attending Physician: Dr. Hershel Los, MD      First Contact: Dr. Dorthy Gavia, MD Pager [phone]    Second Contact: Dr. Jearldine Mina, DO          After Hours (After 5p/  First Contact Pager: [phone]  weekends / holidays): Second Contact Pager: [phone]   SUBJECTIVE   Chief Complaint: fall    History of Present Illness:  Patient is a 73 yo female with medical hx significant for T2DM, HTN, CKD3b, brought to the ED after a ground level fall at home. Patient reports she has been sleeping very little this week due to chronic insomnia. She has also had poor po intake with worsening generalized weakness. After getting up from bed Thursday morning (5/22) she fell to the ground and could not get up. She denies loss of consciousness, hitting her head, or any prodromal symptoms prior to the fall. She felt she was simply too week to stay upright, or to get back up when she fell. She additionally has bilateral shoulder osteoarthritis that made it difficult to stand up. Her family found her on the floor and called EMS. She denies recent fever or illness, dysuria, head or neck pain, constipation, diarrhea.   Notably, she reports multiple falls in the last several years. The most recent fall was in January of this year. Overall, pt with worsening functional status with time. She uses walker and cane at time but states given her home is very small, she always has something to hold onto.   ED Course: Afeb, HDS WBC 15 Cr 1.2, no recent baseline CK 589 Lactate 3.2 ->1.7 UA positive for leukocytes, bacteria, small hemoglobin CT head negative Shoulder Xrs negative for fxs but show bilateral arthritis with severe on left shoulder.  CT abdomen: multiple calculi in left renal collection system  with dilatation. Mass in medial right and lateral left kidney  Past Medical History: Past Medical History:  Diagnosis Date   Bradycardia    Pacemaker placed in 2014   CKD (chronic kidney disease), stage III (HCC)    Coronary artery disease    Depression    Diabetes mellitus without complication (HCC)    Gout    Hypertension    Neuropathy    hands and feet   Presence of permanent cardiac pacemaker 2014   Venous stasis of both lower extremities    Meds:  Current Outpatient Medications  Medication Instructions   acetaminophen (TYLENOL) 650 mg, Oral, Every 6 hours PRN   allopurinol (ZYLOPRIM) 100 mg, Oral, Daily   aspirin 81 MG EC tablet Oral   BD INSULIN  SYRINGE U/F 31G X 5/16" 0.3 ML MISC 2 times daily   Blood Glucose Monitoring Suppl (FIFTY50 GLUCOSE METER 2.0) w/Device KIT Use as directed (check blood glucose 3 times daily.)   Blood Pressure Monitoring (BLOOD PRESSURE CUFF) MISC Does not apply   carvedilol (COREG) 6.25 mg, Oral, 2 times daily with meals   cholecalciferol (VITAMIN D3) 1,000 Units, Oral, Daily   colchicine 0.6 mg, Oral, Daily PRN   dapagliflozin propanediol (FARXIGA) 10 mg, Oral, Daily   DULoxetine (CYMBALTA) 30 MG capsule No dose, route, or frequency  recorded.   gabapentin (NEURONTIN) 100 mg, Oral, 3 times daily PRN   HUMULIN 70/30 (70-30) 100 UNIT/ML injection SMARTSIG:34 Unit(s) SUB-Q Twice Daily   Insulin  Pen Needle (EXEL COMFORT POINT PEN NEEDLE) 29G X MISC Use as directed   Lancets (ONETOUCH DELICA PLUS LANCET30G) MISC USE  TID UTD   latanoprost (XALATAN) 0.005 % ophthalmic solution Ophthalmic   loratadine (CLARITIN) 10 MG tablet Oral   mometasone (NASONEX) 50 MCG/ACT nasal spray 2 sprays, Nasal, Daily   ONETOUCH VERIO test strip USE TID UTD   pioglitazone (ACTOS) 15 MG tablet Oral   rosuvastatin (CRESTOR) 10 mg, Oral, Daily   sodium bicarbonate 650 MG tablet TK 1 T PO BID   timolol  (TIMOPTIC ) 0.5 % ophthalmic solution 1 drop, Every morning    traZODone (DESYREL) 50 mg, Oral, Daily at bedtime   valsartan (DIOVAN) 160 mg, Oral, Daily    Past Surgical History:  Procedure Laterality Date   CATARACT EXTRACTION W/PHACO Left 12/03/2021   Procedure: CATARACT EXTRACTION PHACO AND INTRAOCULAR LENS PLACEMENT (IOC) LEFT DIABETIC  CLARION VIVITY TORIC LENS  4.74  00:45.9;  Surgeon: Annell Kidney, MD;  Location: [facility];  Service: Ophthalmology;  Laterality: Left;  Diabetic   CATARACT EXTRACTION W/PHACO Right 12/17/2021   Procedure: CATARACT EXTRACTION PHACO AND INTRAOCULAR LENS PLACEMENT (IOC) RIGHT DIABETIC CLARION VIVITY TORIC LENS  8.32  01:26.3;  Surgeon: Annell Kidney, MD;  Location: [facility];  Service: Ophthalmology;  Laterality: Right;  Diabetic   INSERT / REPLACE / REMOVE PACEMAKER  2014   NASAL SEPTUM SURGERY     in 1990s   Social:  Living Situation: alone. Independent in ADLs/iADLs. Has multiple family in the area PCP: Lyle San, MD Tobacco: none Alcohol: none Drugs: none  Family History: noncontributory  Allergies: Allergies as of 06/12/2023 - Review Complete 06/12/2023  Allergen Reaction Noted   Neosporin original [bacitracin-neomycin-polymyxin]  01/30/2021   Ceftin [cefuroxime axetil] Swelling and Rash 05/25/2017   Cefuroxime Swelling and Rash 05/25/2017   Latex Swelling and Rash 05/25/2017   Nickel Rash 11/15/2017   Penicillins Swelling and Rash 05/25/2017   Sulfa antibiotics Swelling and Rash 05/25/2017   Tape Itching and Rash 11/25/2021   Review of Systems: A complete ROS was negative except as per HPI.   OBJECTIVE:   Physical Exam: Blood pressure (!) 147/67, pulse 81, temperature 97.6 F (36.4 C), temperature source Oral, resp. rate 17, height 5\' 4"  (1.626 m), weight 117.9 kg, SpO2 96%.  Constitutional: chronically ill appearing, no acute distress. Interactive Cardiovascular: 3/6 systolic murmur. RRR Pulmonary/Chest: CTAB. No respiratory distress Abdominal: soft,  nontender. Normoactive bowel sounds MSK: RUE range of motion limited by should pain. TTP of right shoulder.  Neurological: A&O x 3. Diffuse weakness, 3-4/5 in all limbs. No focal deficits Skin: erythematous, peeling skin of right shin with mildly increased leg circumference compared to right. No discharge  Labs: CBC    Component Value Date/Time   WBC 15.7 (H) 06/12/2023 1751   RBC 4.45 06/12/2023 1751   HGB 13.5 06/12/2023 1751   HCT 43.2 06/12/2023 1751   PLT 232 06/12/2023 1751   MCV 97.1 06/12/2023 1751   MCH 30.3 06/12/2023 1751   MCHC 31.3 06/12/2023 1751   RDW 13.7 06/12/2023 1751   LYMPHSABS 0.9 06/12/2023 1751   MONOABS 1.4 (H) 06/12/2023 1751   EOSABS 0.0 06/12/2023 1751   BASOSABS 0.0 06/12/2023 1751     CMP     Component Value Date/Time   NA 143 06/12/2023  1354   K 4.1 06/12/2023 1354   CL 109 06/12/2023 1354   CO2 21 (L) 06/12/2023 1350   GLUCOSE 175 (H) 06/12/2023 1354   BUN 43 (H) 06/12/2023 1354   CREATININE 1.20 (H) 06/12/2023 1354   CALCIUM 9.7 06/12/2023 1350   PROT 6.9 06/12/2023 1350   ALBUMIN 2.9 (L) 06/12/2023 1350   AST 39 06/12/2023 1350   ALT 31 06/12/2023 1350   ALKPHOS 87 06/12/2023 1350   BILITOT 1.2 06/12/2023 1350   GFRNONAA 41 (L) 06/12/2023 1350   GFRAA 34 (L) 11/15/2017 1830    Imaging: VAS US  LOWER EXTREMITY VENOUS (DVT) (7a-7p) Result Date: 06/12/2023  Lower Venous DVT Study Patient Name:  JUNICE FEI  Date of Exam:   06/12/2023 Medical Rec #: [id number]   Accession #:    [id number] Date of Birth: Sep 03, 1950    Patient Gender: F Patient Age:   51 years Exam Location[facility]  [facility] Procedure:      VAS US  LOWER EXTREMITY VENOUS (DVT) Referring Phys: MELANIE BELFI --------------------------------------------------------------------------------  Indications: Patient fell onto left side 06/10/23 and was in floor until found down today. Multiple open and weeping wounds on legs, elevated CK  Limitations: Body habitus and Weeping, open wounds  on calf. Comparison Study: Prior negative left LEV done 05/21/2021 at [facility] Performing Technologist: Carleene Chase RVS  Examination Guidelines: A complete evaluation includes B-mode imaging, spectral Doppler, color Doppler, and power Doppler as needed of all accessible portions of each vessel. Bilateral testing is considered an integral part of a complete examination. Limited examinations for reoccurring indications may be performed as noted. The reflux portion of the exam is performed with the patient in reverse Trendelenburg.  +-----+---------------+---------+-----------+----------+--------------+ RIGHTCompressibilityPhasicitySpontaneityPropertiesThrombus Aging +-----+---------------+---------+-----------+----------+--------------+ CFV  Full           Yes      Yes                                 +-----+---------------+---------+-----------+----------+--------------+ SFJ  Full                                                        +-----+---------------+---------+-----------+----------+--------------+   +---------+---------------+---------+-----------+----------+-------------------+ LEFT     CompressibilityPhasicitySpontaneityPropertiesThrombus Aging      +---------+---------------+---------+-----------+----------+-------------------+ CFV      Full           Yes      Yes                                      +---------+---------------+---------+-----------+----------+-------------------+ SFJ      Full                                                             +---------+---------------+---------+-----------+----------+-------------------+ FV Prox  Full           Yes      Yes                                      +---------+---------------+---------+-----------+----------+-------------------+  FV Mid   Full                                                             +---------+---------------+---------+-----------+----------+-------------------+ FV  DistalFull                                                             +---------+---------------+---------+-----------+----------+-------------------+ PFV      Full           Yes      Yes                                      +---------+---------------+---------+-----------+----------+-------------------+ POP      Full           Yes      Yes                                      +---------+---------------+---------+-----------+----------+-------------------+ PTV      Full                                         Visualization of                                                          proximal portion                                                          only                +---------+---------------+---------+-----------+----------+-------------------+ PERO     Full                                         Visualization of                                                          proximal portion  only                +---------+---------------+---------+-----------+----------+-------------------+    Summary: RIGHT: - No evidence of common femoral vein obstruction.   LEFT: - There is no evidence of deep vein thrombosis in the visualized veins of the left lower extremity.  - No cystic structure found in the popliteal fossa.  *See table(s) above for measurements and observations.    Preliminary    CT Head Wo Contrast Result Date: 06/12/2023 CLINICAL DATA:  Marvell Slider, head trauma EXAM: CT HEAD WITHOUT CONTRAST TECHNIQUE: Contiguous axial images were obtained from the base of the skull through the vertex without intravenous contrast. RADIATION DOSE REDUCTION: This exam was performed according to the departmental dose-optimization program which includes automated exposure control, adjustment of the mA and/or kV according to patient size and/or use of iterative reconstruction technique. COMPARISON:  None  Available. FINDINGS: Brain: No acute infarct or hemorrhage. Lateral ventricles and midline structures are unremarkable. No acute extra-axial fluid collections. No mass effect. Vascular: No hyperdense vessel or unexpected calcification. Skull: Left temporal scalp edema.  No underlying fracture. Sinuses/Orbits: Opacification of the left maxillary sinus with thickening of the bony margins consistent with chronic sinus disease. Mild mucosal thickening within the ethmoid air cells. Other: None. IMPRESSION: 1. No acute intracranial process. 2. Left temporal scalp edema.  No underlying fracture. Electronically Signed   By: Bobbye Burrow M.D.   On: 06/12/2023 16:51   CT ABDOMEN PELVIS WO CONTRAST Result Date: 06/12/2023 CLINICAL DATA:  Marvell Slider. Multiple chronic wounds on her lower extremities and back. Generalized weakness. EXAM: CT ABDOMEN AND PELVIS WITHOUT CONTRAST TECHNIQUE: Multidetector CT imaging of the abdomen and pelvis was performed following the standard protocol without IV contrast. RADIATION DOSE REDUCTION: This exam was performed according to the departmental dose-optimization program which includes automated exposure control, adjustment of the mA and/or kV according to patient size and/or use of iterative reconstruction technique. COMPARISON:  None Available. FINDINGS: Lower chest: Intracardiac pacer leads. Borderline enlarged heart. Minimal left basilar atelectasis. Hepatobiliary: Possible noncalcified gallstones in the dependent portion of the gallbladder. No gallbladder wall thickening or pericholecystic fluid. Normal-appearing liver. Pancreas: Unremarkable. No pancreatic ductal dilatation or surrounding inflammatory changes. Spleen: Normal in size without focal abnormality. Adrenals/Urinary Tract: Normal-appearing adrenal gland. Large number of calculi in the left renal collecting system with mild-to-moderate dilatation of the collecting system. The collecting system is filled with medium density  material measuring 39 Hounsfield units in density. 1 cm rounded, mildly heterogeneous, high density mass in the medial right kidney. 2 1.4 cm partially exophytic, possible rounded solid masses in the lateral left kidney. Unremarkable ureters and urinary bladder. Stomach/Bowel: Knuckle of transverse colon in a moderate-sized umbilical hernia without wall thickening or obstruction. Normal-appearing stomach, small bowel and appendix. Vascular/Lymphatic: No significant vascular findings are present. No enlarged abdominal or pelvic lymph nodes. Reproductive: Enlarged, mildly irregular uterus.  No adnexal masses. Other: Moderate-sized umbilical hernia containing herniated fat and a knuckle of herniated transverse colon without obstruction. Musculoskeletal: Extensive lumbar and lower thoracic spine degenerative changes. IMPRESSION: 1. Large number of calculi in the left renal collecting system with mild-to-moderate dilatation of the collecting system. The collecting system is filled with medium density material measuring 39 Hounsfield units in density. This could represent blood products, proteinaceous debris or products of chronic infection. 2. 1 cm rounded, mildly heterogeneous, high density mass in the medial right kidney. This could represent a hemorrhagic or proteinaceous cyst. A solid mass is not excluded. A renal ultrasound  is recommended for further evaluation. 3. 2 1.4 cm partially exophytic, possible rounded solid masses in the lateral left kidney. These can also be evaluated with a renal ultrasound. 4. Possible noncalcified gallstones in the dependent portion of the gallbladder. 5. Moderate-sized umbilical hernia containing herniated fat and a knuckle of herniated transverse colon without obstruction. 6. Enlarged, mildly irregular uterus, likely due to fibroids. Electronically Signed   By: Catherin Closs M.D.   On: 06/12/2023 16:43   DG Shoulder Right Result Date: 06/12/2023 CLINICAL DATA:  Fall, shoulder pain  EXAM: RIGHT SHOULDER - 2+ VIEW COMPARISON:  None Available. FINDINGS: Degenerative glenohumeral arthropathy with loss of articular space and spurring although less striking than on the left. Mild degenerative AC joint spurring. No fracture or malalignment. Suboptimal transscapular projection due to patient body habitus. IMPRESSION: 1. 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 No fracture or malalignment is identified. Electronically Signed   By: Freida Jes M.D.   On: 06/12/2023 14:08   DG Shoulder Left Result Date: 06/12/2023 CLINICAL DATA:  Fall, bilateral shoulder pain EXAM: LEFT SHOULDER - 2+ VIEW COMPARISON:  None Available. FINDINGS: Severe degenerative glenohumeral arthropathy with prominent spurring noted. Mild degenerative AC joint spurring. An ECG snap per projects directly over the left humeral head and glenohumeral joint which is suboptimal, and body habitus also reduces diagnostic sensitivity and specificity. However, I do not see a definite acute fracture or malalignment. Dual lead pacer noted. IMPRESSION: 1. Severe degenerative glenohumeral arthropathy/spurring. 2. Mild degenerative AC joint spurring. 3. No definite acute fracture or malalignment. Electronically Signed   By: Freida Jes M.D.   On: 06/12/2023 14:07   DG Chest Port 1 View Result Date: 06/12/2023 CLINICAL DATA:  Questionable sepsis - evaluate for abnormality EXAM: PORTABLE CHEST 1 VIEW COMPARISON:  None Available. FINDINGS: Evaluation is limited by rotation. The cardiomediastinal silhouette is mildly enlarged in contour.LEFT chest cardiac pacing device. No pleural effusion. No pneumothorax. No acute pleuroparenchymal abnormality. Subcortical sclerosis of the LEFT glenohumeral joint with joint space narrowing and osteophyte formation most consistent with degenerative changes. IMPRESSION: No acute cardiopulmonary abnormality. Electronically Signed   By: Clancy Crimes M.D.   On: 06/12/2023 13:16     ASSESSMENT & PLAN:   Shereen Marton is a 73 y.o. person living with a history of falls with poor functional status who presented after found down at home and admitted for a fall without any syncope or presyncopal symptoms.   Ground level fall Deconditioning Imaging overall unremarkable. CK 589, low suspicion for rhabdomyolysis. Based on history, etiology of fall is most likely related to chronic deconditioning with acute weakness during past several days secondary to poor sleep and po intake. Suprisingly not to the degree expected for the reported down-time but pt confirms this time frame. EKG showing NSR with LBBB. Pt is not reporting any LOC and exam is consistent with generalized weakness that further work up is not warranted at this time.  - PT/OT evaluation - Orthostatics ordered - Blood cultures pending  - LR infusion at 157mL/hr for 20 hr  Left lower leg cellulitis Chronic venous stasis ulcer Unclear chronicity of venous stasis ulcer. She states they flare periodically making her ambulation even more difficult. Appears to have acutely worsened recently. DVT US  study negative for acute DVT. Painful, red, erythematous skin with asymmetric leg swelling consistent with cellulitis.  - Received vancomycin, aztreonam in the ED. Will start Rocephin. No purulence noted so will hold MRSA coverage.  - Blood cultures pending  Decubitus  ulcers: Multiple. POA. Have acute on chronic nature. Pt reports prolonged periods of immobilization due to weakness especially since her leg has flared up few weeks ago. On further hx, pt states over the last few months, she has spent considerable time immobile leading to development of these ulcers. Her fall with immobility exposed them to feces leading to further worsening.   UTI UA positive for leukocytes, bacteria, small hgb. Denies dysuria. With likely cellulitis, will treat for UTI - S/p vanc, aztreonam in the ED. Will treat with rocephin as above.  - Urine  culture pending. Likely chronic colonization as pt is asymptomatic.   Renal stones Renal cysts Patient was aware of diagnosis of renal stones and cysts. Follows with nephrology. Denies urinary symptoms or difficulty urinating.  - Bilateral renal ultrasounds ordered  Systolic murmur Noted 3/6 systolic murmur. Echocardiogram ordered  Elevated lactate Downtrending, will continue to trend until at baseline.  - Lactic acid q3 hours for 2 occurrences - LR at 14mL/hr for 20hr  T2DM Moderate SSI TID, qhs  HTN: continue home carvedilol 6.25mg  BID, holding home valsartan.  Gout: Continue home allopurinol HLD: continue home rosuvastatin Insomnia: continue home trazodone 50mg  at bedtime Chronic pain: continue home duloxetine 30mg     Diet: HH/CM VTE: lovenox Code: Full  Prior to Admission Living Arrangement: Home Anticipated Discharge Location: pending PT/OT eval Barriers to Discharge: pending medical stability  Signed: Jayson Michael, MD Internal Medicine Resident PGY-1  06/12/2023, 8:40 PM

## 2023-06-12 NOTE — ED Notes (Signed)
 Patient transported to CT

## 2023-06-12 NOTE — Progress Notes (Signed)
 Elink following for sepsis protocol.

## 2023-06-12 NOTE — ED Notes (Signed)
 [place] IV at bedside

## 2023-06-13 ENCOUNTER — Inpatient Hospital Stay (HOSPITAL_COMMUNITY)

## 2023-06-13 DIAGNOSIS — R531 Weakness: Secondary | ICD-10-CM

## 2023-06-13 DIAGNOSIS — R011 Cardiac murmur, unspecified: Secondary | ICD-10-CM | POA: Diagnosis not present

## 2023-06-13 DIAGNOSIS — W19XXXA Unspecified fall, initial encounter: Secondary | ICD-10-CM

## 2023-06-13 DIAGNOSIS — N2889 Other specified disorders of kidney and ureter: Secondary | ICD-10-CM

## 2023-06-13 DIAGNOSIS — L0390 Cellulitis, unspecified: Secondary | ICD-10-CM | POA: Diagnosis not present

## 2023-06-13 DIAGNOSIS — Y92009 Unspecified place in unspecified non-institutional (private) residence as the place of occurrence of the external cause: Secondary | ICD-10-CM

## 2023-06-13 DIAGNOSIS — N289 Disorder of kidney and ureter, unspecified: Secondary | ICD-10-CM

## 2023-06-13 LAB — CBC
HCT: 38.4 % (ref 36.0–46.0)
Hemoglobin: 12.1 g/dL (ref 12.0–15.0)
MCH: 30.3 pg (ref 26.0–34.0)
MCHC: 31.5 g/dL (ref 30.0–36.0)
MCV: 96 fL (ref 80.0–100.0)
Platelets: 317 10*3/uL (ref 150–400)
RBC: 4 MIL/uL (ref 3.87–5.11)
RDW: 13.7 % (ref 11.5–15.5)
WBC: 14.2 10*3/uL — ABNORMAL HIGH (ref 4.0–10.5)
nRBC: 0 % (ref 0.0–0.2)

## 2023-06-13 LAB — ECHOCARDIOGRAM COMPLETE
AR max vel: 1.22 cm2
AV Area VTI: 1.24 cm2
AV Area mean vel: 1.4 cm2
AV Mean grad: 13 mmHg
AV Peak grad: 24.6 mmHg
Ao pk vel: 2.48 m/s
Area-P 1/2: 2.53 cm2
Height: 64 in
S' Lateral: 3.6 cm
Weight: 4160 [oz_av]

## 2023-06-13 LAB — BASIC METABOLIC PANEL WITH GFR
Anion gap: 11 (ref 5–15)
BUN: 44 mg/dL — ABNORMAL HIGH (ref 8–23)
CO2: 23 mmol/L (ref 22–32)
Calcium: 8.7 mg/dL — ABNORMAL LOW (ref 8.9–10.3)
Chloride: 107 mmol/L (ref 98–111)
Creatinine, Ser: 1.31 mg/dL — ABNORMAL HIGH (ref 0.44–1.00)
GFR, Estimated: 44 mL/min — ABNORMAL LOW (ref >60)
Glucose, Bld: 220 mg/dL — ABNORMAL HIGH (ref 70–99)
Potassium: 3.5 mmol/L (ref 3.5–5.1)
Sodium: 141 mmol/L (ref 135–145)

## 2023-06-13 LAB — GLUCOSE, CAPILLARY
Glucose-Capillary: 195 mg/dL — ABNORMAL HIGH (ref 70–99)
Glucose-Capillary: 145 mg/dL — ABNORMAL HIGH (ref 70–99)
Glucose-Capillary: 188 mg/dL — ABNORMAL HIGH (ref 70–99)
Glucose-Capillary: 190 mg/dL — ABNORMAL HIGH (ref 70–99)

## 2023-06-13 LAB — MAGNESIUM: Magnesium: 1.6 mg/dL — ABNORMAL LOW (ref 1.7–2.4)

## 2023-06-13 LAB — PHOSPHORUS: Phosphorus: 2.5 mg/dL (ref 2.5–4.6)

## 2023-06-13 LAB — LACTIC ACID, PLASMA: Lactic Acid, Venous: 1.3 mmol/L (ref 0.5–1.9)

## 2023-06-13 MED ORDER — NYSTATIN 100000 UNIT/GM EX POWD
Freq: Two times a day (BID) | CUTANEOUS | Status: DC
  Filled 2023-06-13: qty 15

## 2023-06-13 MED ORDER — LINEZOLID 600 MG PO TABS
600.0000 mg | ORAL_TABLET | Freq: Two times a day (BID) | ORAL | Status: AC
  Administered 2023-06-13 – 2023-06-16 (×8): 600 mg via ORAL
  Filled 2023-06-13 (×8): qty 1

## 2023-06-13 MED ORDER — MEDIHONEY WOUND/BURN DRESSING EX PSTE
1.0000 | PASTE | Freq: Every day | CUTANEOUS | Status: DC
  Administered 2023-06-13 – 2023-06-21 (×9): 1 via TOPICAL
  Filled 2023-06-13: qty 44

## 2023-06-13 MED ORDER — PERFLUTREN LIPID MICROSPHERE
1.0000 mL | INTRAVENOUS | Status: AC | PRN
  Administered 2023-06-13: 4 mL via INTRAVENOUS

## 2023-06-13 MED ORDER — LACTATED RINGERS IV SOLN: INTRAVENOUS | Status: DC

## 2023-06-13 MED ORDER — MAGNESIUM SULFATE 2 GM/50ML IV SOLN
2.0000 g | Freq: Once | INTRAVENOUS | Status: AC
  Administered 2023-06-13: 2 g via INTRAVENOUS
  Filled 2023-06-13: qty 50

## 2023-06-13 NOTE — Progress Notes (Signed)
 Present on admission

## 2023-06-13 NOTE — Progress Notes (Addendum)
 Overnight events: None  Subjective: Laying in bed comfortably, no acute distress.  Says she feels much better than when she came in.  Does not any concerns this morning.  Says she does not have an appetite.  Objective:  Vital signs in last 24 hours: Vitals:   06/12/23 1938 06/12/23 2314 06/13/23 0505 06/13/23 0721  BP: (!) 147/67 (!) 141/50 (!) 156/53 (!) 132/49  Pulse: 81 85 73 71  Resp: 17 20 19 17   Temp: 97.6 F (36.4 C) 98.3 F (36.8 C) 98.7 F (37.1 C)   TempSrc: Oral Oral Axillary   SpO2: 96% 95% 95% 99%  Weight: 117.9 kg     Height: 5\' 4"  (1.626 m)      Physical Exam: Constitutional: Chronically ill-appearing older woman, laying in bed in no acute distress Cardio: Regular rate and rhythm, no murmurs appreciated, appears euvolemic Pulm: Respiratory rate and effort Abdomen: Soft, nontender, nondistended; mild discomfort in the suprapubic region Skin: Intertrigo rash within the skin folds, particularly abdomen; several pressure wounds (see media images); left leg venous stasis dermatitis with superimposed cellulitis Neuro: Alert and oriented x 3, laying in bed with eyes closed, responding appropriately Psych: Normal mood and affect  Labs: No new labs this morning  Assessment/Plan: Principal Problem:   Fall at home, initial encounter Active Problems:   Elevated CK   Physical deconditioning   Weakness generalized   Cellulitis of leg, left   Ulcer of perineum (HCC)   Patient Summary: Debbie Wiggins is a 73 y.o. person living with a history of falls with poor functional status who presented after being found down at home and is admitted for recurrent falls and left leg cellulitis on hospital day 1.  Ground-level fall Deconditioning Based on the history, it seems that she has poor functional status and the fall as a result of general decline and deconditioning.  Less likely cardiogenic, though she does have a systolic murmur and history of sick sinus syndrome s/p  pacemaker placement. Will rule out orthostatic or infectious processes. PT and OT will see her and provide the recommendations for disposition planning. - PT/OT eval  - Orthostatics pending - Echocardiogram pending  Left leg cellulitis Venous stasis dermatitis Intertrigo Pressure injuries Her multiple pressure injuries suggest that she is relatively immobile and has had poor functional status for quite some time.  Wound team was consulted for further evaluation of these wounds in addition to her cellulitis.  Left leg appears to have cellulitis superimposed on venous stasis dermatitis.  We will continue with p.o. treatment for this infection and wound care. - Linezolid 600 mg twice daily for 4 days - Blood cultures pending - Wound care per wound team - Nystatin powder BID  Hypomagnesemia Magnesium 1.6, repleted with 2g IV magnesium. Will recheck in am.   Sick sinus syndrome Left bundle branch block Prolonged Qtc LBBB seen on EKG. QRS diffusely widened, Qtc prolonged at 516. S/p pacemaker placement on 07/2010. No prior EKG for comparison. - Cardiac telemetry - Will interrogate the pacemaker  Asymptomatic bacteriuria UA with leukocytes, bacteria.  However, patient denies any urinary symptoms.  Urinary cultures pending.  No concerns for active urinary tract infection.  Renal stones/cysts Reportedly chronic per patient. Follows with nephrology outpatient. No urinary symptoms currently. Renal ultrasound ordered, non-diagnostic given body habitus and poor patient participation. We will run this by urology for further guidance.   Chronic conditions: T2DM: A1c 6.7%. Moderate SSI TID, qhs HTN: continue home carvedilol 6.25mg  BID, holding home valsartan.  Gout: Continue home allopurinol HLD: continue home rosuvastatin Insomnia: continue home trazodone 50mg  at bedtime Chronic pain: continue home duloxetine 30mg    Diet: HH/CM IVF: None VTE: Enoxaparin Code: Full   Dispo: Anticipated  discharge to  Home vs SNF  pending medical stability and safe dispo plan.   Dorthy Gavia, MD 06/13/2023, 11:52 AM After 5pm on weekdays and 1pm on weekends: On Call pager [phone]

## 2023-06-13 NOTE — Evaluation (Signed)
 Physical Therapy Evaluation Patient Details Name: Debbie Wiggins MRN: [id number] DOB: December 25, 1950 Today's Date: 06/13/2023  History of Present Illness  The pt is a 73 yo female presenting 5/24 after being on the ground since fall on morning of 5/22. Pt admitted for management of UTI and multiple acute on chronic venous stasis ulcers in LE and decubitus ulcers on sacrum. PMH includes: DM II, HTN, CKD III, CAD, neuropathy, and frequent falls.   Clinical Impression  Pt in bed upon arrival of PT, agreeable to evaluation at this time. Prior to admission the pt was independent with mobility, living alone without hx of other falls. The pt required mod-maxA to complete bed mobility and was unable to generate any hip clearance with standing attempt despite maxA of 2. Pt with limited strength and power in LE as well as reports of limited strength and impaired ROM in R shoulder after fall. Will benefit from continued skilled PT and post-acute rehab <3hours/day to facilitate return to independence.      If plan is discharge home, recommend the following: Two people to help with walking and/or transfers;Two people to help with bathing/dressing/bathroom;Assistance with cooking/housework;Direct supervision/assist for financial management;Direct supervision/assist for medications management;Assist for transportation;Assistance with feeding;Help with stairs or ramp for entrance   Can travel by private vehicle   No    Equipment Recommendations Wheelchair (measurements PT);Wheelchair cushion (measurements PT)  Recommendations for Other Services       Functional Status Assessment Patient has had a recent decline in their functional status and demonstrates the ability to make significant improvements in function in a reasonable and predictable amount of time.     Precautions / Restrictions Precautions Precautions: Fall Recall of Precautions/Restrictions: Intact Restrictions Weight Bearing Restrictions Per Provider  Order: No      Mobility  Bed Mobility Overal bed mobility: Needs Assistance Bed Mobility: Rolling, Sidelying to Sit, Sit to Sidelying Rolling: Mod assist Sidelying to sit: Max assist, HOB elevated, +2 for physical assistance     Sit to sidelying: Max assist, +2 for physical assistance General bed mobility comments: maxA to complete with assist at trunk to elevate and LE to return to bed    Transfers Overall transfer level: Needs assistance Equipment used: Rolling walker (2 wheels) Transfers: Sit to/from Stand Sit to Stand: Total assist           General transfer comment: pt unable to generate any hip clearance despite maxA of 2       Balance Overall balance assessment: Needs assistance Sitting-balance support: No upper extremity supported, Feet supported Sitting balance-Leahy Scale: Fair   Postural control: Posterior lean Standing balance support: Bilateral upper extremity supported, During functional activity, Reliant on assistive device for balance Standing balance-Leahy Scale: Zero Standing balance comment: unable to clear hips                             Pertinent Vitals/Pain Pain Assessment Pain Assessment: 0-10 Pain Score: 6  Pain Location: R shoulder, wounds Pain Descriptors / Indicators: Discomfort, Grimacing, Sore Pain Intervention(s): Limited activity within patient's tolerance, Monitored during session, Repositioned    Home Living Family/patient expects to be discharged to:: Private residence Living Arrangements: Alone Available Help at Discharge: Family;Available PRN/intermittently Type of Home: House Home Access: Stairs to enter Entrance Stairs-Rails: Right Entrance Stairs-Number of Steps: 4   Home Layout: One level Home Equipment: Agricultural consultant (2 wheels);Cane - single point      Prior Function Prior Level  of Function : Independent/Modified Independent;Driving             Mobility Comments: independent but no consistent  exercise or activity. landed hard on R shoulder recently and has had difficulty since then ADLs Comments: independent     Extremity/Trunk Assessment   Upper Extremity Assessment Upper Extremity Assessment: Right hand dominant;RUE deficits/detail RUE Deficits / Details: limited flexion and abd reaching due to pain in upper trap    Lower Extremity Assessment Lower Extremity Assessment: Generalized weakness (extensive wounds, grossly 4-/5 to MMT, more limited in LLE than RLE)    Cervical / Trunk Assessment Cervical / Trunk Assessment: Kyphotic;Other exceptions Cervical / Trunk Exceptions: increased body habitus  Communication   Communication Communication: No apparent difficulties    Cognition Arousal: Alert Behavior During Therapy: WFL for tasks assessed/performed   PT - Cognitive impairments: No apparent impairments                         Following commands: Intact       Cueing Cueing Techniques: Verbal cues     General Comments General comments (skin integrity, edema, etc.): VSS on RA, RN present dressing wounds    Exercises     Assessment/Plan    PT Assessment Patient needs continued PT services  PT Problem List Decreased strength;Decreased range of motion;Decreased activity tolerance;Decreased balance;Decreased mobility;Impaired sensation;Decreased skin integrity;Obesity       PT Treatment Interventions DME instruction;Gait training;Stair training;Functional mobility training;Therapeutic exercise;Therapeutic activities;Balance training;Patient/family education    PT Goals (Current goals can be found in the Care Plan section)  Acute Rehab PT Goals Patient Stated Goal: return to independence PT Goal Formulation: With patient Time For Goal Achievement: 06/27/23 Potential to Achieve Goals: Good    Frequency Min 2X/week        AM-PAC PT "6 Clicks" Mobility  Outcome Measure Help needed turning from your back to your side while in a flat bed  without using bedrails?: A Lot Help needed moving from lying on your back to sitting on the side of a flat bed without using bedrails?: Total Help needed moving to and from a bed to a chair (including a wheelchair)?: Total Help needed standing up from a chair using your arms (e.g., wheelchair or bedside chair)?: Total Help needed to walk in hospital room?: Total Help needed climbing 3-5 steps with a railing? : Total 6 Click Score: 7    End of Session Equipment Utilized During Treatment: Gait belt Activity Tolerance: Patient limited by pain Patient left: in bed;with call [employer]/phone within reach;with bed alarm set;with nursing/sitter in room Nurse Communication: Mobility status;Need for lift equipment PT Visit Diagnosis: Unsteadiness on feet (R26.81);Other abnormalities of gait and mobility (R26.89);Muscle weakness (generalized) (M62.81);Pain Pain - part of body:  (wounds at R hip, back)    Time: 1517-6160 PT Time Calculation (min) (ACUTE ONLY): 35 min   Charges:   PT Evaluation $PT Eval Moderate Complexity: 1 Mod PT Treatments $Therapeutic Activity: 8-22 mins PT General Charges $$ ACUTE PT VISIT: 1 Visit         Barnabas Booth, PT, DPT   Acute Rehabilitation Department Office [phone] Secure Chat Communication Preferred  Lona Rist 06/13/2023, 6:28 PM

## 2023-06-13 NOTE — Plan of Care (Signed)

## 2023-06-13 NOTE — Consult Note (Addendum)
 WOC Nurse Consult Note: Reason for Consult: multiple wounds  Wound type: 1.  Full thickness B lower legs r/t venous insufficiency pink moist with scattered yellow necrotic  2. Full thickness mid lower abdomen 75% slough 25% moist  3.  Sacrum/buttocks Stage 3 Pressure Injury with pink with scattered areas of tan slough  4. Posterior left upper extremity full thickness 80% slough 20% pink  5.  Left upper buttock blister (stage 2 pressure injury)  6.  L hip deep tissue pressure injury vs skin tear  7. R posterior upper thigh/ischium Unstageable Pressure Injury 100% eschar  8. R mid back Unstageable PI 100% tan slough  Pressure Injury POA: Yes Measurement: see nursing flowsheet  Wound bed: as above  Drainage (amount, consistency, odor) see nursing flowsheet  Periwound: erythema to B lower legs  Dressing procedure/placement/frequency:  Cleanse B lower legs (intact skin and wounds) with Vashe wound cleanser Timm Foot [phone]) do not rinse and allow to air dry.  Apply Xeroform gauze to wound beds daily Timm Foot [phone]), cover with ABD pad and secure with Kerlix roll gauze starting right above toes and ending right below.  Apply Ace bandage wrapped in same fashion as Kerlix for light compression.  Cleanse sacrum/buttocks/ischium/posterior thighs with Vashe wound cleanser Timm Foot [phone]) do not rinse and allow to air dry. Apply Medihoney to necrotic wound beds daily, cover with dry gauze and silicone foam or ABD pad whichever is preferred. Cover blister left upper buttock with silicone foam, assess daily.  Cleanse mid lower abdomen, posterior left upper arm, back wounds with Vashe, apply Medihoney to wound beds daily, cover with dry gauze and silicone foam or ABD pad whichever is preferred.  Cleanse L outer hip wound with Vashe, apply Xeroform Timm Foot [phone]) gauze daily and cover with silicone foam or ABD pad and tape.    POC discussed with bedside nurse.  Patient would benefit from a low air loss mattress  d/t multiple pressure injuries on multiple turning surfaces.   WOC team will not follow. Patient post fall L hip question DTPI vs skin tear.  WOC should be reconsulted if necrotic (black/brown/gray/tan/yellow) tissue develops.    Thank you,    Ronni Colace MSN, RN-BC, [employer] [phone]

## 2023-06-14 ENCOUNTER — Inpatient Hospital Stay (HOSPITAL_COMMUNITY)

## 2023-06-14 LAB — GLUCOSE, CAPILLARY
Glucose-Capillary: 163 mg/dL — ABNORMAL HIGH (ref 70–99)
Glucose-Capillary: 202 mg/dL — ABNORMAL HIGH (ref 70–99)
Glucose-Capillary: 209 mg/dL — ABNORMAL HIGH (ref 70–99)
Glucose-Capillary: 168 mg/dL — ABNORMAL HIGH (ref 70–99)

## 2023-06-14 LAB — CBC
HCT: 37 % (ref 36.0–46.0)
Hemoglobin: 11.4 g/dL — ABNORMAL LOW (ref 12.0–15.0)
MCH: 30.2 pg (ref 26.0–34.0)
MCHC: 30.8 g/dL (ref 30.0–36.0)
MCV: 98.1 fL (ref 80.0–100.0)
Platelets: 290 10*3/uL (ref 150–400)
RBC: 3.77 MIL/uL — ABNORMAL LOW (ref 3.87–5.11)
RDW: 13.9 % (ref 11.5–15.5)
WBC: 12.3 10*3/uL — ABNORMAL HIGH (ref 4.0–10.5)
nRBC: 0 % (ref 0.0–0.2)

## 2023-06-14 LAB — BASIC METABOLIC PANEL WITH GFR
Anion gap: 9 (ref 5–15)
BUN: 45 mg/dL — ABNORMAL HIGH (ref 8–23)
CO2: 21 mmol/L — ABNORMAL LOW (ref 22–32)
Calcium: 8.3 mg/dL — ABNORMAL LOW (ref 8.9–10.3)
Chloride: 108 mmol/L (ref 98–111)
Creatinine, Ser: 1.45 mg/dL — ABNORMAL HIGH (ref 0.44–1.00)
GFR, Estimated: 39 mL/min — ABNORMAL LOW (ref >60)
Glucose, Bld: 177 mg/dL — ABNORMAL HIGH (ref 70–99)
Potassium: 3.3 mmol/L — ABNORMAL LOW (ref 3.5–5.1)
Sodium: 138 mmol/L (ref 135–145)

## 2023-06-14 LAB — URINE CULTURE: Culture: >=100000 — AB

## 2023-06-14 LAB — MAGNESIUM: Magnesium: 1.9 mg/dL (ref 1.7–2.4)

## 2023-06-14 MED ORDER — POTASSIUM CHLORIDE 20 MEQ PO PACK: 40.0000 meq | PACK | Freq: Two times a day (BID) | ORAL | Status: DC

## 2023-06-14 MED ORDER — POTASSIUM CHLORIDE 20 MEQ PO PACK
40.0000 meq | PACK | ORAL | Status: AC
  Administered 2023-06-14 (×2): 40 meq via ORAL
  Filled 2023-06-14 (×2): qty 2

## 2023-06-14 NOTE — Progress Notes (Signed)
 RN tried to get the orthostatic vitals with the help of OT. Coudnt do as patient was unable to stand up. Made the provider aware

## 2023-06-14 NOTE — Evaluation (Addendum)
 Occupational Therapy Evaluation Patient Details Name: Debbie Wiggins MRN: [id number] DOB: Oct 14, 1950 Today's Date: 06/14/2023   History of Present Illness   The pt is a 73 yo female presenting 5/24 after being on the ground since fall on morning of 5/22. Pt admitted for management of UTI and multiple acute on chronic venous stasis ulcers in LE and decubitus ulcers on sacrum. PMH includes: DM II, HTN, CKD III, CAD, neuropathy, and frequent falls.     Clinical Impressions Pt reports ind at baseline with ADLs/functional mobility, though does endorse furniture walking at home. Pt lives alone. Pt currently needing set up -max A for Adls, mod-max+2 for bed mobility and total +2 for transfer attempts with RW. Pt minimally able to clear hips from EOB despite +2 assist, anticipate Stedy/Lift for OOB at this time. Pt presenting with impairments listed below, will follow acutely. Patient will benefit from continued inpatient follow up therapy, <3 hours/day to maximize safety/ind with ADL/functional mobility.  BP supine 125/60 (74) BP seated 107/94 (100)      If plan is discharge home, recommend the following:   Two people to help with walking and/or transfers;A lot of help with bathing/dressing/bathroom;Assistance with cooking/housework;Direct supervision/assist for medications management;Direct supervision/assist for financial management;Assist for transportation;Help with stairs or ramp for entrance     Functional Status Assessment   Patient has had a recent decline in their functional status and demonstrates the ability to make significant improvements in function in a reasonable and predictable amount of time.     Equipment Recommendations   Other (comment) (defer)     Recommendations for Other Services   PT consult     Precautions/Restrictions   Precautions Precautions: Fall Recall of Precautions/Restrictions: Intact     Mobility Bed Mobility Overal bed mobility: Needs  Assistance Bed Mobility: Rolling, Sidelying to Sit, Sit to Sidelying Rolling: Mod assist Sidelying to sit: Max assist, HOB elevated, +2 for physical assistance     Sit to sidelying: Max assist, +2 for physical assistance General bed mobility comments: maxA to complete with assist at trunk to elevate and LE to return to bed    Transfers Overall transfer level: Needs assistance Equipment used: Rolling walker (2 wheels) Transfers: Sit to/from Stand Sit to Stand: Total assist, +2 physical assistance, Max assist           General transfer comment: minimally able to clear hips from EOB      Balance Overall balance assessment: Needs assistance Sitting-balance support: No upper extremity supported, Feet supported Sitting balance-Leahy Scale: Fair     Standing balance support: Bilateral upper extremity supported, During functional activity, Reliant on assistive device for balance Standing balance-Leahy Scale: Zero Standing balance comment: unable to clear hips                           ADL either performed or assessed with clinical judgement   ADL Overall ADL's : Needs assistance/impaired Eating/Feeding: Set up;Sitting   Grooming: Set up;Sitting   Upper Body Bathing: Moderate assistance;Bed level   Lower Body Bathing: Maximal assistance;Bed level   Upper Body Dressing : Moderate assistance   Lower Body Dressing: Maximal assistance   Toilet Transfer: Maximal assistance;+2 for physical assistance;Rolling walker (2 wheels)   Toileting- Clothing Manipulation and Hygiene: Total assistance       Functional mobility during ADLs: Maximal assistance;+2 for physical assistance;Rolling walker (2 wheels)       Vision   Vision Assessment?: No apparent visual deficits  Perception Perception: Not tested       Praxis Praxis: Not tested       Pertinent Vitals/Pain Pain Assessment Pain Assessment: Faces Pain Score: 5  Faces Pain Scale: Hurts even  more Pain Location: wounds Pain Descriptors / Indicators: Discomfort, Grimacing, Sore Pain Intervention(s): Monitored during session, Limited activity within patient's tolerance, Repositioned     Extremity/Trunk Assessment Upper Extremity Assessment Upper Extremity Assessment: Right hand dominant;Generalized weakness RUE Deficits / Details: PROM WFL, limited flexion due to pain, is able to grasp RW   Lower Extremity Assessment Lower Extremity Assessment: Defer to PT evaluation   Cervical / Trunk Assessment Cervical / Trunk Assessment: Kyphotic;Other exceptions Cervical / Trunk Exceptions: increased body habitus   Communication Communication Communication: No apparent difficulties   Cognition Arousal: Alert Behavior During Therapy: WFL for tasks assessed/performed Cognition: No apparent impairments                               Following commands: Intact       Cueing  General Comments   Cueing Techniques: Verbal cues  VSS   Exercises     Shoulder Instructions      Home Living Family/patient expects to be discharged to:: Private residence Living Arrangements: Alone Available Help at Discharge: Family;Available PRN/intermittently Type of Home: House Home Access: Stairs to enter [employer] of Steps: 4 Entrance Stairs-Rails: Right Home Layout: One level     Bathroom Shower/Tub: Producer, television/film/video: Handicapped height     Home Equipment: Agricultural consultant (2 wheels);Cane - single point;Toilet riser;Shower seat          Prior Functioning/Environment Prior Level of Function : Independent/Modified Independent             Mobility Comments: independent but no consistent exercise or activity. landed hard on R shoulder recently and has had difficulty since then ADLs Comments: independent    OT Problem List: Decreased strength;Decreased range of motion;Decreased activity tolerance;Impaired balance (sitting and/or  standing);Decreased safety awareness;Cardiopulmonary status limiting activity;Decreased coordination;Impaired UE functional use   OT Treatment/Interventions: Self-care/ADL training;Therapeutic exercise;Energy conservation;DME and/or AE instruction;Therapeutic activities;Patient/family education;Balance training      OT Goals(Current goals can be found in the care plan section)   Acute Rehab OT Goals Patient Stated Goal: none stated OT Goal Formulation: With patient Time For Goal Achievement: 06/28/23 Potential to Achieve Goals: Good ADL Goals Pt Will Perform Upper Body Dressing: with min assist;sitting Pt Will Perform Lower Body Dressing: with min assist;sitting/lateral leans;sit to/from stand Pt Will Transfer to Toilet: with min assist;stand pivot transfer;squat pivot transfer;bedside commode Pt Will Perform Tub/Shower Transfer: Shower transfer;with min assist;Squat pivot transfer;Stand pivot transfer;shower seat   OT Frequency:  Min 2X/week    Co-evaluation              AM-PAC OT "6 Clicks" Daily Activity     Outcome Measure Help from another person eating meals?: A Little Help from another person taking care of personal grooming?: A Little Help from another person toileting, which includes using toliet, bedpan, or urinal?: A Lot Help from another person bathing (including washing, rinsing, drying)?: A Lot Help from another person to put on and taking off regular upper body clothing?: A Lot Help from another person to put on and taking off regular lower body clothing?: A Lot 6 Click Score: 14   End of Session Equipment Utilized During Treatment: Gait belt;Rolling walker (2 wheels) Nurse Communication: Mobility  status  Activity Tolerance: Patient tolerated treatment well Patient left: in bed;with call [employer]/phone within reach;with bed alarm set;with nursing/sitter in room  OT Visit Diagnosis: Unsteadiness on feet (R26.81);Other abnormalities of gait and mobility  (R26.89);Muscle weakness (generalized) (M62.81);History of falling (Z91.81)                Time: 5784-6962 OT Time Calculation (min): 32 min Charges:  OT General Charges $OT Visit: 1 Visit OT Evaluation $OT Eval Moderate Complexity: 1 Mod OT Treatments $Self Care/Home Management : 8-22 mins  Makaylie Dedeaux K, OTD, OTR/L SecureChat Preferred Acute Rehab [phone]   Benedict Brain Koonce 06/14/2023, 10:26 AM

## 2023-06-14 NOTE — Progress Notes (Signed)
 RN tried to replace mattress per order. As per the secretory, "they don't have any air mattresses available right now but they'll bring on as soon as they get it."

## 2023-06-14 NOTE — [place] FL2 (Signed)
 [place]  MEDICAID FL2 LEVEL OF CARE FORM     IDENTIFICATION  Patient Name: Debbie Wiggins Birthdate: Aug 03, 1950 Sex: female Admission Date (Current Location): 06/12/2023  [facility] and [place] Number:  Producer, television/film/video and Address:  [place], [address], [place], [place] [address]      Provider Number: [id number]  Attending Physician Name and Address:  Bevelyn Bryant, MD  Relative Name and Phone Number:  Hershall Lory (Brother)  [phone]    Current Level of Care: Hospital Recommended Level of Care: Skilled Nursing Facility Prior Approval Number:    Date Approved/Denied:   PASRR Number: [id number]  Discharge Plan: SNF    Current Diagnoses: Patient Active Problem List   Diagnosis Date Noted   Renal mass 06/13/2023   Cellulitis 06/13/2023   Fall at home, initial encounter 06/12/2023   Elevated CK 06/12/2023   Physical deconditioning 06/12/2023   Weakness generalized 06/12/2023   Cellulitis of leg, left 06/12/2023   Ulcer of perineum (HCC) 06/12/2023   Benign hypertensive kidney disease with chronic kidney disease 10/02/2019   Nephrolithiasis 10/02/2019   Secondary hyperparathyroidism of renal origin (HCC) 10/02/2019   Stage 3b chronic kidney disease (HCC) 10/02/2019   Acidosis 12/14/2018   Atherosclerosis of renal artery (HCC) 12/14/2018   Gout 12/14/2018   Type 2 diabetes mellitus with diabetic chronic kidney disease (HCC) 12/14/2018   Cardiac pacemaker 12/08/2017   Essential (primary) hypertension 12/08/2017    Orientation RESPIRATION BLADDER Height & Weight     Self, Time, Situation, Place  Normal Incontinent, External catheter Weight: 260 lb (117.9 kg) Height:  5\' 4"  (162.6 cm)  BEHAVIORAL SYMPTOMS/MOOD NEUROLOGICAL BOWEL NUTRITION STATUS      Incontinent Diet (see DC summary)  AMBULATORY STATUS COMMUNICATION OF NEEDS Skin   Extensive Assist Verbally Other (Comment) (Venous stasis ulcer Pretibial Left.Abdomen Lower.Buttocks  Right.Upper Pressure ulcer bilateral buttocksThigh Left;Posterior open wound to back of LUE.Buttocks Left. fluid filled blister. Thigh, Left;Deep tissue wound.Lumbar Lateral;Right Wound, tunneling)                       Personal Care Assistance Level of Assistance  Bathing, Feeding, Dressing Bathing Assistance: Maximum assistance Feeding assistance: Limited assistance Dressing Assistance: Maximum assistance     Functional Limitations Info  Sight, Hearing, Speech Sight Info: Impaired Hearing Info: Adequate Speech Info: Adequate    SPECIAL CARE FACTORS FREQUENCY  PT (By licensed PT), OT (By licensed OT)     PT Frequency: 5x/week OT Frequency: 5x/week            Contractures Contractures Info: Not present    Additional Factors Info  Code Status, Allergies, Insulin  Sliding Scale Code Status Info: FULL Allergies Info: Ceftin (Cefuroxime Axetil)  Cefuroxime  Latex  Neosporin Original (Bacitracin-neomycin-polymyxin)  Nickel  Penicillins  Spironolactone  Sulfa Antibiotics  Tape   Insulin  Sliding Scale Info: see DC summary       Current Medications (06/14/2023):  This is the current hospital active medication list Current Facility-Administered Medications  Medication Dose Route Frequency Provider Last Rate Last Admin   acetaminophen (TYLENOL) tablet 650 mg  650 mg Oral Q6H PRN Khan, Ghalib, MD   650 mg at 06/14/23 1000   Or   acetaminophen (TYLENOL) suppository 650 mg  650 mg Rectal Q6H PRN Khan, Ghalib, MD       albuterol (PROVENTIL) (2.5 MG/3ML) 0.083% nebulizer solution 2.5 mg  2.5 mg Nebulization Q2H PRN Jackolyn Masker, MD  allopurinol (ZYLOPRIM) tablet 100 mg  100 mg Oral Daily Jackolyn Masker, MD   100 mg at 06/14/23 0839   carvedilol (COREG) tablet 6.25 mg  6.25 mg Oral BID WC Khan, Ghalib, MD   6.25 mg at 06/14/23 0839   DULoxetine (CYMBALTA) DR capsule 30 mg  30 mg Oral Daily Khan, Ghalib, MD   30 mg at 06/14/23 0839   enoxaparin (LOVENOX) injection 40 mg  40 mg  Subcutaneous Q24H Jayson Michael, MD   40 mg at 06/13/23 2102   insulin  aspart (novoLOG ) injection 0-15 Units  0-15 Units Subcutaneous TID [facility] Khan, Ghalib, MD   5 Units at 06/14/23 1249   insulin  aspart (novoLOG ) injection 0-5 Units  0-5 Units Subcutaneous QHS Khan, Ghalib, MD   2 Units at 06/12/23 2236   leptospermum manuka honey (MEDIHONEY) paste 1 Application  1 Application Topical Daily Bevelyn Bryant, MD   1 Application at 06/14/23 0845   linezolid (ZYVOX) tablet 600 mg  600 mg Oral Q12H Justus, Michael, MD   600 mg at 06/14/23 0839   nystatin (MYCOSTATIN/NYSTOP) topical powder   Topical BID Dorthy Gavia, MD   Given at 06/14/23 0850   rosuvastatin (CRESTOR) tablet 10 mg  10 mg Oral Daily Khan, Ghalib, MD   10 mg at 06/14/23 0839   traZODone (DESYREL) tablet 50 mg  50 mg Oral QHS Khan, Ghalib, MD   50 mg at 06/13/23 2102     Discharge Medications: Please see discharge summary for a list of discharge medications.  Relevant Imaging Results:  Relevant Lab Results:   Additional Information ZOX:[id number]  Kennen Stammer A [place], LCSW

## 2023-06-14 NOTE — Progress Notes (Signed)
 Patient was taken for the ultrasound scan via bed at 1400pm

## 2023-06-14 NOTE — Progress Notes (Addendum)
 Overnight events: None  Subjective: Sitting up in bed eating breakfast.  Much more alert this morning.  Says she feels good this morning.  Had some pain last night and received pain medications for her chronic wounds, but now feels okay.  Says she was out of yesterday because she had taken a sleeping pill.  Objective:  Vital signs in last 24 hours: Vitals:   06/14/23 0130 06/14/23 0540 06/14/23 0735 06/14/23 1000  BP: (!) 120/56 (!) 126/59 (!) 113/54 (!) 107/94  Pulse: 74 70 68 79  Resp: 16 18 19    Temp: 98 F (36.7 C) 98 F (36.7 C) 97.8 F (36.6 C)   TempSrc: Oral Oral Oral   SpO2: 95% 96% 98%   Weight:      Height:       Physical Exam: Constitutional: Chronically ill-appearing older woman, sitting up in bed no acute distress Cardio: Regular rate and rhythm, no murmurs appreciated, appears euvolemic Pulm: Respiratory rate and effort, lungs clear to auscultation bilaterally Abdomen: Soft, nontender, nondistended Skin: Intertrigo rash within the skin folds; pressure wounds covered with dressings per wound care team; cellulitis of left lower extremity  Neuro: Alert and oriented x 3, sitting up in bed eating breakfast, much more alert and conversational than yesterday  Labs: Potassium 3.3 WBC improving, 12.3 Hgb 11.4, down from 12.1 Mag 1.9 Cr 1.45, near baseline, BUN 45  Assessment/Plan: Principal Problem:   Fall at home, initial encounter Active Problems:   Elevated CK   Physical deconditioning   Weakness generalized   Cellulitis of leg, left   Ulcer of perineum (HCC)   Renal mass   Cellulitis   Patient Summary: Debbie Wiggins is a 73 y.o. person living with a history of falls with poor functional status who presented after being found down at home and is admitted for recurrent falls and left leg cellulitis on hospital day 2.  Ground-level fall Deconditioning No new concerns this morning.  She says that she feels like her blood sugar was too low at home, causing her  to fall and not be able to get up for a few days.  She states she has not been eating or drinking as much as she should be recently.  Her appetite has returned in hospital and she is eating her breakfast this morning.  PT recommended SNF, so we are awaiting placement.  Echocardiogram unremarkable. - PT/OT - SNF placement pending, TOC assistance appreciated - Orthostatics pending  Left leg cellulitis Venous stasis dermatitis Intertrigo Pressure injuries Clinically appears to be improving this morning. Afebrile, WBC downtrending. Has some pain from her pressure wounds, however this should decrease as she is able to mobilize more with physical therapy and at the SNF facility.  Will continue with antibiotics and topical treatments. - Linezolid 600 mg twice daily for 3 more days - Blood cultures with no growth x 2 days - Wound care per wound team, appreciate assistance - Nystatin powder BID  Hypomagnesemia, Resolved Hypokalemia Magnesium 1.9 after repletion. Potassium 3.3 this morning, repleted with 40 mEq potassium.   Sick sinus syndrome Left bundle branch block Prolonged Qtc EKG on admission showed LBBB, QRS diffusely widened, Qtc prolonged at 516. S/p pacemaker placement on 07/2010. No prior EKG for comparison. - Cardiac telemetry - EKG this morning - Paged rep for pacemaker interrogation  Asymptomatic bacteriuria UA with leukocytes, bacteria. Culture with >100k E. Coli. However, patient denies any urinary symptoms. Asymptomatic, so no concerns for active urinary tract infection.  Renal stones/cysts Reportedly  chronic per patient for ~35 years. Occasionally has stones pass, but usually passes them easily with minimal pain. Follows with nephrology outpatient. No urinary symptoms currently. Renal ultrasound yesterday was non-diagnostic given poor patient participation. She is more alert today and may be able to participate better.  - Touched base with urology about debris collection seen  on CT, likely chronic, no intervention needed at this time.  - Repeat renal ultrasound to evaluate bilateral renal masses  Chronic conditions: T2DM: A1c 6.7%. Moderate SSI TID, qhs HTN: continue home carvedilol 6.25mg  BID, holding home valsartan.  Gout: Continue home allopurinol HLD: continue home rosuvastatin Insomnia: continue home trazodone 50mg  at bedtime Chronic pain: continue home duloxetine 30mg    Diet: HH/CM IVF: None VTE: Enoxaparin Code: Full   Dispo: Anticipated discharge to  SNF  pending medical stability and safe dispo plan.   Dorthy Gavia, MD 06/14/2023, 10:19 AM After 5pm on weekdays and 1pm on weekends: On Call pager [phone]

## 2023-06-14 NOTE — TOC Initial Note (Signed)
 Transition of Care [facility]) - Initial/Assessment Note    Patient Details  Name: Debbie Wiggins MRN: [id number] Date of Birth: 1950-06-04  [facility]) CM/SW Contact:    Debbie Whiters A [place], LCSW Phone Number: 06/14/2023, 3:35 PM  Clinical Narrative:                  Pt is from home alone. CSW met with pt at bedside along with her sister, Debbie Wiggins. CSW met with pt at bedside. Pt was agreeable to referral for SNF.  SNF workup completed. Bed offers pending.  Preference for [facility] rehab.    TOC will continue to follow.     Expected Discharge Plan: Skilled Nursing Facility Barriers to Discharge: Continued Medical Work up, SNF Pending bed offer, Insurance Authorization   Patient Goals and CMS Choice            Expected Discharge Plan and Services       Living arrangements for the past 2 months: Skilled Nursing Facility                                      Prior Living Arrangements/Services Living arrangements for the past 2 months: Skilled Nursing Facility Lives with:: Self          Need for Family Participation in Patient Care: Yes (Comment) Care giver support system in place?: Yes (comment) (pt's sister Debbie Wiggins)      Activities of Daily Living   ADL Screening (condition at time of admission) Independently performs ADLs?: No Does the patient have a NEW difficulty with bathing/dressing/toileting/self-feeding that is expected to last >3 days?: Yes (Initiates electronic notice to provider for possible OT consult) Does the patient have a NEW difficulty with getting in/out of bed, walking, or climbing stairs that is expected to last >3 days?: Yes (Initiates electronic notice to provider for possible PT consult) Does the patient have a NEW difficulty with communication that is expected to last >3 days?: No Is the patient deaf or have difficulty hearing?: No Does the patient have difficulty seeing, even when wearing glasses/contacts?: No Does the patient have difficulty  concentrating, remembering, or making decisions?: No  Permission Sought/Granted                  Emotional Assessment Appearance:: Appears older than stated age Attitude/Demeanor/Rapport: Gracious Affect (typically observed): Appropriate Orientation: : Oriented to Self, Oriented to Place, Oriented to  Time, Oriented to Situation Alcohol / Substance Use: Not Applicable Psych Involvement: No (comment)  Admission diagnosis:  Renal mass [N28.89] Acute cystitis without hematuria [N30.00] Fall, initial encounter [W19.XXXA] Fall at home, initial encounter [phone], Y92.009] Cellulitis, unspecified cellulitis site [L03.90] Sepsis, due to unspecified organism, unspecified whether acute organ dysfunction present [facility]) [A41.9] Patient Active Problem List   Diagnosis Date Noted   Renal mass 06/13/2023   Cellulitis 06/13/2023   Fall at home, initial encounter 06/12/2023   Elevated CK 06/12/2023   Physical deconditioning 06/12/2023   Weakness generalized 06/12/2023   Cellulitis of leg, left 06/12/2023   Ulcer of perineum (HCC) 06/12/2023   Benign hypertensive kidney disease with chronic kidney disease 10/02/2019   Nephrolithiasis 10/02/2019   Secondary hyperparathyroidism of renal origin (HCC) 10/02/2019   Stage 3b chronic kidney disease (HCC) 10/02/2019   Acidosis 12/14/2018   Atherosclerosis of renal artery (HCC) 12/14/2018   Gout 12/14/2018   Type 2 diabetes mellitus with diabetic chronic kidney disease (HCC)  12/14/2018   Cardiac pacemaker 12/08/2017   Essential (primary) hypertension 12/08/2017   PCP:  Debbie San, MD Pharmacy:   [id number] - [place], [place] - [address] AT [facility] OF [address] [place] [address] Phone: [phone] Fax: [phone]     Social Drivers of Health (SDOH) Social History: SDOH Screenings   Food Insecurity: Food Insecurity Present (06/12/2023)  Housing: Low Risk  (06/12/2023)   Transportation Needs: Unmet Transportation Needs (06/12/2023)  Utilities: Not At Risk (06/12/2023)  Financial Resource Strain: Patient Declined (10/12/2022)   Received from [facility]  Physical Activity: Unknown (12/08/2017)   [facility] from [facility]  Social Connections: Moderately Integrated (06/12/2023)  Tobacco Use: Low Risk  (06/12/2023)   SDOH Interventions:     Readmission Risk Interventions     No data to display

## 2023-06-15 DIAGNOSIS — L89500 Pressure ulcer of unspecified ankle, unstageable: Secondary | ICD-10-CM

## 2023-06-15 DIAGNOSIS — N200 Calculus of kidney: Secondary | ICD-10-CM

## 2023-06-15 DIAGNOSIS — I447 Left bundle-branch block, unspecified: Secondary | ICD-10-CM

## 2023-06-15 DIAGNOSIS — L304 Erythema intertrigo: Secondary | ICD-10-CM

## 2023-06-15 DIAGNOSIS — L03116 Cellulitis of left lower limb: Principal | ICD-10-CM

## 2023-06-15 DIAGNOSIS — R9431 Abnormal electrocardiogram [ECG] [EKG]: Secondary | ICD-10-CM

## 2023-06-15 DIAGNOSIS — I495 Sick sinus syndrome: Secondary | ICD-10-CM

## 2023-06-15 DIAGNOSIS — I872 Venous insufficiency (chronic) (peripheral): Secondary | ICD-10-CM | POA: Diagnosis not present

## 2023-06-15 LAB — BASIC METABOLIC PANEL WITH GFR
Anion gap: 6 (ref 5–15)
BUN: 42 mg/dL — ABNORMAL HIGH (ref 8–23)
CO2: 22 mmol/L (ref 22–32)
Calcium: 8.3 mg/dL — ABNORMAL LOW (ref 8.9–10.3)
Chloride: 111 mmol/L (ref 98–111)
Creatinine, Ser: 1.54 mg/dL — ABNORMAL HIGH (ref 0.44–1.00)
GFR, Estimated: 36 mL/min — ABNORMAL LOW (ref >60)
Glucose, Bld: 176 mg/dL — ABNORMAL HIGH (ref 70–99)
Potassium: 4 mmol/L (ref 3.5–5.1)
Sodium: 139 mmol/L (ref 135–145)

## 2023-06-15 LAB — CBC
HCT: 36.8 % (ref 36.0–46.0)
Hemoglobin: 11.6 g/dL — ABNORMAL LOW (ref 12.0–15.0)
MCH: 30.5 pg (ref 26.0–34.0)
MCHC: 31.5 g/dL (ref 30.0–36.0)
MCV: 96.8 fL (ref 80.0–100.0)
Platelets: 274 10*3/uL (ref 150–400)
RBC: 3.8 MIL/uL — ABNORMAL LOW (ref 3.87–5.11)
RDW: 13.9 % (ref 11.5–15.5)
WBC: 11 10*3/uL — ABNORMAL HIGH (ref 4.0–10.5)
nRBC: 0 % (ref 0.0–0.2)

## 2023-06-15 LAB — GLUCOSE, CAPILLARY
Glucose-Capillary: 251 mg/dL — ABNORMAL HIGH (ref 70–99)
Glucose-Capillary: 176 mg/dL — ABNORMAL HIGH (ref 70–99)
Glucose-Capillary: 180 mg/dL — ABNORMAL HIGH (ref 70–99)
Glucose-Capillary: 181 mg/dL — ABNORMAL HIGH (ref 70–99)

## 2023-06-15 MED ORDER — CALCITRIOL 0.25 MCG PO CAPS
0.2500 ug | ORAL_CAPSULE | ORAL | Status: DC
  Administered 2023-06-16 – 2023-06-21 (×3): 0.25 ug via ORAL
  Filled 2023-06-15 (×3): qty 1

## 2023-06-15 MED ORDER — DAPAGLIFLOZIN PROPANEDIOL 10 MG PO TABS
10.0000 mg | ORAL_TABLET | Freq: Every day | ORAL | Status: DC
  Administered 2023-06-15 – 2023-06-21 (×7): 10 mg via ORAL
  Filled 2023-06-15 (×7): qty 1

## 2023-06-15 MED ORDER — LATANOPROST 0.005 % OP SOLN
1.0000 [drp] | Freq: Every day | OPHTHALMIC | Status: DC
  Administered 2023-06-15 – 2023-06-20 (×6): 1 [drp] via OPHTHALMIC
  Filled 2023-06-15: qty 2.5

## 2023-06-15 MED ORDER — LORATADINE 10 MG PO TABS: 10.0000 mg | ORAL_TABLET | Freq: Every day | ORAL | Status: DC | PRN

## 2023-06-15 MED ORDER — GABAPENTIN 100 MG PO CAPS
100.0000 mg | ORAL_CAPSULE | Freq: Three times a day (TID) | ORAL | Status: DC
  Administered 2023-06-15 – 2023-06-21 (×18): 100 mg via ORAL
  Filled 2023-06-15 (×18): qty 1

## 2023-06-15 MED ORDER — TIMOLOL MALEATE 0.5 % OP SOLN
1.0000 [drp] | Freq: Every morning | OPHTHALMIC | Status: DC
  Administered 2023-06-15 – 2023-06-21 (×7): 1 [drp] via OPHTHALMIC
  Filled 2023-06-15: qty 5

## 2023-06-15 NOTE — Progress Notes (Signed)
  Overnight events: None  Subjective:  Patient is evaluated at bedside, reports that she wished to get her pacemaker exchanged here. Reports that she did not sleep well last night. Denies any other concerns this morning.   Objective:  Vital signs in last 24 hours: Vitals:   06/14/23 1626 06/14/23 1949 06/15/23 0435 06/15/23 0810  BP: (!) 124/55 (!) 129/55 (!) 138/55 120/71  Pulse: 63 60 66 68  Resp: 19 17 18    Temp: 97.8 F (36.6 C) 97.9 F (36.6 C) 97.7 F (36.5 C) 97.6 F (36.4 C)  TempSrc: Oral Oral Oral Oral  SpO2: 98% 100% 96% 100%  Weight:      Height:       Physical Exam: Constitutional: Chronically ill-appearing older woman, sitting up in bed no acute distress Cardio: Regular rate and rhythm, no murmurs appreciated, appears euvolemic Pulm: Respiratory rate and effort, lungs clear to auscultation bilaterally Skin: Legs wrapped in dressings, but patient says wound team said the cellulitis looked better today Neuro: Alert and oriented x 3, no focal neurodeficits  Labs: Potassium 4.0 WBC improving, 11 Hgb 11.6 Cr 1.54, near baseline  Assessment/Plan: Principal Problem:   Fall at home, initial encounter Active Problems:   Elevated CK   Physical deconditioning   Weakness generalized   Cellulitis of leg, left   Ulcer of perineum (HCC)   Renal mass   Cellulitis  Patient Summary: Debbie Wiggins is a 73 y.o. person living with a history of falls with poor functional status who presented after being found down at home and is admitted for recurrent falls and left leg cellulitis on hospital day 3.  Ground-level fall Physical deconditioning No new concerns this morning.  SNF placement is pending.  Unable to do orthostatics due to patient weakness yesterday and inability to stand.  Pending sorting out her pacemaker, she will be medically stable for discharge to SNF.  - Continue PT/OT - SNF placement pending, TOC assistance appreciated  Left leg cellulitis Venous stasis  dermatitis Intertrigo Pressure injuries Per patient, cellulitic wound appears to be healing well.  Dressings are clean and dry, changed this morning.  She remains afebrile, WBC improving.  - Linezolid 600 mg twice daily for 2 more days - Blood cultures with no growth x 2 days - Wound care per wound team, appreciate assistance - Nystatin powder BID  Hypokalemia, resolved Potassium 4.0 this morning following repletion.  Sick sinus syndrome Left bundle branch block Prolonged Qtc EKG on admission showed LBBB, Qtc prolonged at 516. S/p pacemaker placement on 07/2010. Pacemaker interrogated yesterday, report says it is at end of device life with a high atrial tach/fib burden.  - Cardiac telemetry - EP consulted, appreciate it   Renal stones/cysts Reportedly chronic per patient for ~35 years. Occasionally has stones pass, but usually passes them easily with minimal pain. Follows with nephrology outpatient. No urinary symptoms currently. Nothing to do per urology. Renal ultrasound unable to visualize these structures well.   Chronic conditions: T2DM: A1c 6.7%. Moderate SSI TID, qhs HTN: continue home carvedilol 6.25mg  BID, holding home valsartan.  Gout: Continue home allopurinol HLD: continue home rosuvastatin Insomnia: continue home trazodone 50mg  at bedtime Chronic pain: continue home duloxetine 30mg    Diet: HH/CM IVF: None VTE: Enoxaparin Code: Full   Dispo: Anticipated discharge to  SNF  pending medical stability and safe dispo plan.   Dorthy Gavia, MD 06/15/2023, 1:05 PM After 5pm on weekdays and 1pm on weekends: On Call pager [phone]

## 2023-06-15 NOTE — Progress Notes (Signed)
 Called regarding patient's device interrogation.  Currently admitted with cellulitis of LLE / being treated with Linezolid and fall (not syncope).  She has previously followed with Dr. Parks Bollman at the [facility] in [place] (lives in [place]).     Interrogation reviewed which shows the following:   Device Implant 08/18/2010  ERI as of 06/13/2023  / battery voltage 2.11V (ERI at 2.6V)  Mode: DDD 60  Base Rate: 60 bpm Max Track Rate 120 bpm  PAVD 200 ms  SAVD 200 ms   She is not dependent on device. AP 15% / VP 55%.  She does have noted atrial lead noise at times and suspect this may account for some of her mode switch.  Ventricular rates well controlled in 70-90's during AT/AF.  AMS <1%.    Reviewed with Primary Team, pt will need to be clear of infection before generator change can occur.  She has a full three months to allow for generator change.  Reviewed normal device function & that it will continue to do so during this three month period.     She has follow up on 6/224/25 at 2pm with Dr. Cleda Curly.       Creighton Doffing, NP-C, AGACNP-BC [facility] - Electrophysiology  06/15/2023, 3:11 PM

## 2023-06-15 NOTE — Plan of Care (Signed)
  Problem: Coping: Goal: Ability to adjust to condition or change in health will improve Outcome: Progressing   Problem: Fluid Volume: Goal: Ability to maintain a balanced intake and output will improve Outcome: Progressing   Problem: Health Behavior/Discharge Planning: Goal: Ability to identify and utilize available resources and services will improve Outcome: Progressing   Problem: Health Behavior/Discharge Planning: Goal: Ability to manage health-related needs will improve Outcome: Progressing   Problem: Skin Integrity: Goal: Risk for impaired skin integrity will decrease Outcome: Progressing

## 2023-06-15 NOTE — Progress Notes (Signed)
 Physical Therapy Treatment Patient Details Name: Debbie Wiggins MRN: [id number] DOB: 15-Dec-1950 Today's Date: 06/15/2023   History of Present Illness The pt is a 73 yo female presenting 5/24 after being on the ground since fall on morning of 5/22. Pt admitted for management of UTI and multiple acute on chronic venous stasis ulcers in LE and decubitus ulcers on sacrum. PMH includes: DM II, HTN, CKD III, CAD, PPM, neuropathy, and frequent falls.    PT Comments  Pt received in supine, reluctant to participate in transfer training due to concerns and anxiety about her pacemaker, MD notified. After discussion with PTA, pt agreeable to attempt OOB transfer to recliner via hoyer lift, as a compromise for "less strenuous" but still beneficial activity for her. Once up in chair, PTA demonstrated Stedy sit to stand lift technique with her as an intermediate option prior to attempting to stand again with RW. Pt agreeable to try Stedy, but not today, although she denies significant fatigue or other symptoms after mechanical lift to chair. Discussion on pressure relief frequency/technique and benefit of increased activity, pressure relief cushion and air boots ordered to protect her when in bed/chair, RN notified as well. HR WFL 69-70 bpm per telemetry. Patient will benefit from continued inpatient follow up therapy, <3 hours/day     If plan is discharge home, recommend the following: Two people to help with walking and/or transfers;Two people to help with bathing/dressing/bathroom;Assistance with cooking/housework;Direct supervision/assist for financial management;Direct supervision/assist for medications management;Assist for transportation;Assistance with feeding;Help with stairs or ramp for entrance   Can travel by private vehicle     No  Equipment Recommendations  Wheelchair (measurements PT);Wheelchair cushion (measurements PT);Other (comment) (currently would need hoyer lift and hospital air bed also, pending  progress)    Recommendations for Other Services       Precautions / Restrictions Precautions Precautions: Fall Recall of Precautions/Restrictions: Intact Precaution/Restrictions Comments: multiple wounds Restrictions Weight Bearing Restrictions Per Provider Order: No     Mobility  Bed Mobility Overal bed mobility: Needs Assistance Bed Mobility: Rolling, Supine to Sit Rolling: Mod assist, Used rails, Max assist   Supine to sit: Mod assist, Used rails, HOB elevated (to long sitting from reclined posture in chair)     General bed mobility comments: maxA to roll to her L due to R shoulder pain and modA to roll to her R side during lift pad placement. Pt performs supine to long sitting in chair with modA trunk support; pt defers standing trials once up in recliner.    Transfers Overall transfer level: Needs assistance   Transfers: Bed to chair/wheelchair/BSC             General transfer comment: Alen Amy OOB to chair after discussion on Stedy vs hoyer per pt request as she states she is worried about her pacemaker malfunctioning and does not want to stress herself too much. Pt shown Stedy after transfer to recliner via hoyer and PTA demos how to transfer with this device from chair or EOB and pt states she agrees to try this next session, but not today. Transfer via Lift Equipment: Maximove  Ambulation/Gait                   Stairs             Wheelchair Mobility     Tilt Bed    Modified Rankin (Stroke Patients Only)       Balance Overall balance assessment: Needs assistance Sitting-balance support: No upper extremity supported,  Feet supported Sitting balance-Leahy Scale: Poor Sitting balance - Comments: needs UE support to maintain long sitting in chair   Standing balance support: Bilateral upper extremity supported, During functional activity, Reliant on assistive device for balance   Standing balance comment: pt defers to try, used hoyer for OOB  to chair                            Communication Communication Communication: No apparent difficulties  Cognition Arousal: Alert Behavior During Therapy: WFL for tasks assessed/performed   PT - Cognitive impairments: Sequencing, Problem solving                       PT - Cognition Comments: Decreased insight into benefits of mobility. Pt expressed anxiety about her pacemaker, although PTA reassured her with postural changes that her HR is stable (and per MD note it does not appear that they intend to replace it on this admission). Pt agreeable to participate in modified plan for OOB within her concerns in a "less strenuous" manner. Following commands: Intact      Cueing Cueing Techniques: Verbal cues, Gestural cues  Exercises Other Exercises: recliner "crunches" reclined posture to long sitting x2 reps for teachback, pt using arm rests to pull up. Other Exercises: supine BLE AAROM: heel slides, SLR x5 reps ea Other Exercises: reviewed supine vs seated pressure relief technique/frequency    General Comments General comments (skin integrity, edema, etc.): RN and unit secretary notified pt would benefit from geomat pressure relief cushion for her chair and prevalon boots to protect her heels in the bed, pt agreeable. HR WFL 69 bpm supine and 69-70 bpm sitting in recliner after transfer. No dizziness reported with postural changes. PTA wrote on board for staff to assist her to roll Q2H and hoyer OOB to chair daily (at minimum). Pt encouraged to perform supine HEP per handout a couple times a day.      Pertinent Vitals/Pain Pain Assessment Pain Assessment: Faces Faces Pain Scale: Hurts little more Pain Location: wounds on bottom and LLE as well as R shoulder with flexion/reaching. Pain Descriptors / Indicators: Discomfort, Grimacing, Sore Pain Intervention(s): Limited activity within patient's tolerance, Monitored during session, Repositioned    Home Living                           Prior Function            PT Goals (current goals can now be found in the care plan section) Acute Rehab PT Goals Patient Stated Goal: return to independence PT Goal Formulation: With patient Time For Goal Achievement: 06/27/23 Progress towards PT goals: Progressing toward goals    Frequency    Min 2X/week      PT Plan      Co-evaluation              AM-PAC PT "6 Clicks" Mobility   Outcome Measure  Help needed turning from your back to your side while in a flat bed without using bedrails?: Total (w/o rails) Help needed moving from lying on your back to sitting on the side of a flat bed without using bedrails?: Total Help needed moving to and from a bed to a chair (including a wheelchair)?: Total Help needed standing up from a chair using your arms (e.g., wheelchair or bedside chair)?: Total Help needed to walk in hospital room?: Total Help needed climbing  3-5 steps with a railing? : Total 6 Click Score: 6    End of Session Equipment Utilized During Treatment: Other (comment) (mechanical lift and pad) Activity Tolerance: Patient tolerated treatment well;Patient limited by fatigue;Other (comment) (self-limited due to concerns about cardiac/pacemaker functioning) Patient left: in chair;with call [place]/phone within reach;with chair alarm set;Other (comment) (heels floated and pillows behind back and under her hips for comfort) Nurse Communication: Mobility status;Need for lift equipment;Precautions;Other (comment) (air cushion for chair and prevalon boots ordered) PT Visit Diagnosis: Unsteadiness on feet (R26.81);Other abnormalities of gait and mobility (R26.89);Muscle weakness (generalized) (M62.81);Pain Pain - Right/Left: Left Pain - part of body: Leg (+ R shoulder and bottom -wounds)     Time: 1610-9604 PT Time Calculation (min) (ACUTE ONLY): 27 min  Charges:    $Therapeutic Activity: 23-37 mins PT General Charges $$ ACUTE PT  VISIT: 1 Visit                     Auden Wettstein P., PTA Acute Rehabilitation Services Secure Chat Preferred 9a-5:30pm Office: [phone]    Mariel Shope [facility] 06/15/2023, 3:48 PM

## 2023-06-16 DIAGNOSIS — L03116 Cellulitis of left lower limb: Secondary | ICD-10-CM | POA: Diagnosis not present

## 2023-06-16 DIAGNOSIS — I872 Venous insufficiency (chronic) (peripheral): Secondary | ICD-10-CM | POA: Diagnosis not present

## 2023-06-16 DIAGNOSIS — L304 Erythema intertrigo: Secondary | ICD-10-CM | POA: Diagnosis not present

## 2023-06-16 DIAGNOSIS — L89500 Pressure ulcer of unspecified ankle, unstageable: Secondary | ICD-10-CM | POA: Diagnosis not present

## 2023-06-16 LAB — CBC
HCT: 37.2 % (ref 36.0–46.0)
Hemoglobin: 11.6 g/dL — ABNORMAL LOW (ref 12.0–15.0)
MCH: 30.5 pg (ref 26.0–34.0)
MCHC: 31.2 g/dL (ref 30.0–36.0)
MCV: 97.9 fL (ref 80.0–100.0)
Platelets: 270 10*3/uL (ref 150–400)
RBC: 3.8 MIL/uL — ABNORMAL LOW (ref 3.87–5.11)
RDW: 14 % (ref 11.5–15.5)
WBC: 11.5 10*3/uL — ABNORMAL HIGH (ref 4.0–10.5)
nRBC: 0 % (ref 0.0–0.2)

## 2023-06-16 LAB — BASIC METABOLIC PANEL WITH GFR
Anion gap: 8 (ref 5–15)
BUN: 39 mg/dL — ABNORMAL HIGH (ref 8–23)
CO2: 26 mmol/L (ref 22–32)
Calcium: 8.7 mg/dL — ABNORMAL LOW (ref 8.9–10.3)
Chloride: 110 mmol/L (ref 98–111)
Creatinine, Ser: 1.44 mg/dL — ABNORMAL HIGH (ref 0.44–1.00)
GFR, Estimated: 39 mL/min — ABNORMAL LOW (ref >60)
Glucose, Bld: 194 mg/dL — ABNORMAL HIGH (ref 70–99)
Potassium: 4.4 mmol/L (ref 3.5–5.1)
Sodium: 144 mmol/L (ref 135–145)

## 2023-06-16 LAB — GLUCOSE, CAPILLARY
Glucose-Capillary: 191 mg/dL — ABNORMAL HIGH (ref 70–99)
Glucose-Capillary: 180 mg/dL — ABNORMAL HIGH (ref 70–99)
Glucose-Capillary: 169 mg/dL — ABNORMAL HIGH (ref 70–99)
Glucose-Capillary: 164 mg/dL — ABNORMAL HIGH (ref 70–99)

## 2023-06-16 MED ORDER — LACTATED RINGERS IV BOLUS
500.0000 mL | Freq: Once | INTRAVENOUS | Status: AC
  Administered 2023-06-16: 500 mL via INTRAVENOUS

## 2023-06-16 NOTE — Progress Notes (Signed)
 Overnight events: None  Subjective: Patient is evaluated at bedside. Feels better this morning. Says she was able to stand with therapy for 15-20 minutes, able to participate and feels proud of herself. Has wound pain but denies any new symptoms including lightheadedness, dizziness.  Objective:  Vital signs in last 24 hours: Vitals:   06/16/23 0603 06/16/23 0846 06/16/23 1252 06/16/23 1343  BP: (!) 126/56 (!) 124/53 (!) 103/46 (!) 102/40  Pulse: 83 76 62   Resp: 16 18    Temp: 98 F (36.7 C) 98.2 F (36.8 C) 99.9 F (37.7 C)   TempSrc:  Oral Oral   SpO2: 94% 94% 100%   Weight:      Height:       Physical Exam: Constitutional: Chronically ill-appearing older woman, laying in bed in no distress Cardio: Regular rate and rhythm, no murmurs appreciated Pulm: Respiratory rate and effort Skin: Legs wrapped in dressing; dressing dry and in tact Neuro: Alert and oriented x 3, no focal neuro deficits  Labs: CBC, BMP at baseline, unchanged from yesterday Glucose 191  Assessment/Plan: Principal Problem:   Fall at home, initial encounter Active Problems:   Elevated CK   Physical deconditioning   Weakness generalized   Cellulitis of leg, left   Ulcer of perineum (HCC)   Renal mass   Cellulitis  Patient Summary: Debbie Wiggins is a 73 y.o. person living with a history of falls with poor functional status who presented after being found down at home and is admitted for recurrent falls and left leg cellulitis on hospital day 4.  Ground-level fall Physical deconditioning Suspect orthostatic hypotension Suspect in the setting of general deconditioning and poor intake/orthostasis. Have been unable to check orthostatics while here due to patient inability to tolerate. Blood pressure was a little low this afternoon; she did not eat much today, says she feels thirsty, may be volume down.  - 500cc LR bolus - Continue PT/OT - SNF placement pending, TOC assistance  appreciated  Bradycardia HR in the 50s this morning. Per chart review, it looks like she is prescribed Coreg for hypertension. Her BP has been well-controlled while here without her home blood pressure medications. Given this, I wonder if she has been hypotensive on these medications at home, leading to her falls. If her BP remains well controlled, we will continue to hold them at discharge.  - Holding coreg given bradycardia  Left leg cellulitis Venous stasis dermatitis Intertrigo Pressure injuries She remains afebrile, no leukocytosis, hemodynamically stable. Today is the last day of antibiotics. Cellulitis looks better on exam. She still has LLE discoloration, which is likely venous stasis dermatitis.  - Linezolid 600 mg twice daily; today is final day of abx - Wound care per wound team; will continue at discharge - Nystatin powder BID  Sick sinus syndrome Left bundle branch block Prolonged Qtc She has a follow up appointment scheduled in late June for replacement of her pacemaker. Per EP, the battery should last several months. High AT/AF burden noted on interrogation was in the remote past, per EP. VT noted was with rates <80 bpm, so low concern for VT. We provided her with education about the pacemaker and she is agreeable to follow up with her previous cardiologist, Dr. Parks Bollman.   Chronic conditions: T2DM: A1c 6.7%. Moderate SSI TID, qhs HTN: Holding carvedilol 6.25mg  BID, home valsartan, chlorthalidone. Gout: Continue home allopurinol HLD: continue home rosuvastatin Insomnia: continue home trazodone 50mg  at bedtime Chronic pain: continue home duloxetine 30mg   Renal stones/cysts:  Reportedly chronic per patient for ~35 years. Occasionally has stones pass, but usually passes them easily with minimal pain. Follows with nephrology outpatient. No urinary symptoms currently. Nothing to do per urology. Renal ultrasound unable to visualize these structures well.   Diet: HH/CM IVF:  500cc LR bolus VTE: Enoxaparin Code: Full   Dispo: Anticipated discharge to  SNF  pending medical stability and safe dispo plan.   Dorthy Gavia, MD 06/16/2023, 1:52 PM After 5pm on weekdays and 1pm on weekends: On Call pager [phone]

## 2023-06-16 NOTE — Progress Notes (Addendum)
 Occupational Therapy Treatment Patient Details Name: Debbie Wiggins MRN: [id number] DOB: 01-07-1951 Today's Date: 06/16/2023   History of present illness The pt is a 73 yo female presenting 5/24 after being on the ground since fall on morning of 5/22. Pt admitted for management of UTI and multiple acute on chronic venous stasis ulcers in LE and decubitus ulcers on sacrum. PMH includes: DM II, HTN, CKD III, CAD, PPM, neuropathy, and frequent falls.   OT comments  Pt progressing towards goals, able to transfer OOB to chair via stedy this session. Pt needed mod +2 for bed mobility and mod-max A+2 for standing in stedy frame. Pt able to maintain high sitting position in stedy x15 min for wound care, and stands in stedy x2-3 mins for pericare. Pt presenting with impairments listed below, will follow acutely. Patient will benefit from continued inpatient follow up therapy, <3 hours/day to maximize safety/ind with ADL/functional mobility.       If plan is discharge home, recommend the following:  Two people to help with walking and/or transfers;A lot of help with bathing/dressing/bathroom;Assistance with cooking/housework;Direct supervision/assist for medications management;Direct supervision/assist for financial management;Assist for transportation;Help with stairs or ramp for entrance   Equipment Recommendations  Other (comment) (defer)    Recommendations for Other Services PT consult    Precautions / Restrictions Precautions Precautions: Fall Recall of Precautions/Restrictions: Intact Precaution/Restrictions Comments: multiple wounds Restrictions Weight Bearing Restrictions Per Provider Order: No       Mobility Bed Mobility Overal bed mobility: Needs Assistance Bed Mobility: Rolling, Supine to Sit   Sidelying to sit: Mod assist, +2 for physical assistance       General bed mobility comments: mod A +2 sidelying to sit EOB    Transfers Overall transfer level: Needs  assistance Equipment used: Ambulation equipment used Transfers: Bed to chair/wheelchair/BSC, Sit to/from Stand Sit to Stand: Max assist, Mod assist, +2 physical assistance             Transfer via Lift Equipment: Stedy   Balance Overall balance assessment: Needs assistance Sitting-balance support: No upper extremity supported, Feet supported Sitting balance-Leahy Scale: Fair     Standing balance support: Bilateral upper extremity supported, During functional activity, Reliant on assistive device for balance Standing balance-Leahy Scale: Poor Standing balance comment: heavy reliance on external support                           ADL either performed or assessed with clinical judgement   ADL Overall ADL's : Needs assistance/impaired     Grooming: Moderate assistance;Standing Grooming Details (indicate cue type and reason): while pt standing in stedy         Upper Body Dressing : Minimal assistance;Sitting;Standing Upper Body Dressing Details (indicate cue type and reason): donning clean gown         Toileting- Clothing Manipulation and Hygiene: Total assistance;Sit to/from stand Toileting - Clothing Manipulation Details (indicate cue type and reason): standing in stedy            Extremity/Trunk Assessment Upper Extremity Assessment Upper Extremity Assessment: Right hand dominant RUE Deficits / Details: PROM WFL, limited flexion due to pain, is able to grasp RW   Lower Extremity Assessment Lower Extremity Assessment: Defer to PT evaluation        Vision   Vision Assessment?: No apparent visual deficits   Perception Perception Perception: Not tested   Praxis Praxis Praxis: Not tested   Communication Communication Communication: No apparent difficulties  Cognition Arousal: Alert Behavior During Therapy: WFL for tasks assessed/performed Cognition: No apparent impairments                               Following commands:  Intact        Cueing   Cueing Techniques: Verbal cues, Gestural cues  Exercises      Shoulder Instructions       General Comments VSS    Pertinent Vitals/ Pain       Pain Assessment Pain Assessment: Faces Pain Score: 4  Faces Pain Scale: Hurts little more Pain Location: wounds on bottom and LLE as well as R shoulder with flexion/reaching. Pain Descriptors / Indicators: Discomfort, Grimacing, Sore Pain Intervention(s): Limited activity within patient's tolerance, Monitored during session, Repositioned  Home Living                                          Prior Functioning/Environment              Frequency  Min 2X/week        Progress Toward Goals  OT Goals(current goals can now be found in the care plan section)  Progress towards OT goals: Progressing toward goals  Acute Rehab OT Goals Patient Stated Goal: did not state OT Goal Formulation: With patient Time For Goal Achievement: 06/28/23 Potential to Achieve Goals: Good ADL Goals Pt Will Perform Upper Body Dressing: with min assist;sitting Pt Will Perform Lower Body Dressing: with min assist;sitting/lateral leans;sit to/from stand Pt Will Transfer to Toilet: with min assist;stand pivot transfer;squat pivot transfer;bedside commode Pt Will Perform Tub/Shower Transfer: Shower transfer;with min assist;Squat pivot transfer;Stand pivot transfer;shower seat  Plan      Co-evaluation                 AM-PAC OT "6 Clicks" Daily Activity     Outcome Measure   Help from another person eating meals?: A Little Help from another person taking care of personal grooming?: A Little Help from another person toileting, which includes using toliet, bedpan, or urinal?: A Lot Help from another person bathing (including washing, rinsing, drying)?: A Lot Help from another person to put on and taking off regular upper body clothing?: A Lot Help from another person to put on and taking off regular  lower body clothing?: A Lot 6 Click Score: 14    End of Session Equipment Utilized During Treatment: Other (comment) (stedy)  OT Visit Diagnosis: Unsteadiness on feet (R26.81);Other abnormalities of gait and mobility (R26.89);Muscle weakness (generalized) (M62.81);History of falling (Z91.81)   Activity Tolerance Patient tolerated treatment well   Patient Left with call [place]/phone within reach;in chair;with chair alarm set   Nurse Communication Mobility status        Time: 1027-1100 OT Time Calculation (min): 33 min  Charges: OT General Charges $OT Visit: 1 Visit OT Treatments $Self Care/Home Management : 8-22 mins $Therapeutic Activity: 8-22 mins  Tammara Massing K, OTD, OTR/L SecureChat Preferred Acute Rehab [phone]   Arlayne Liggins K Koonce 06/16/2023, 1:40 PM

## 2023-06-16 NOTE — Plan of Care (Signed)

## 2023-06-16 NOTE — Care Management Important Message (Signed)
 Important Message  Patient Details  Name: Debbie Wiggins MRN: [id number] Date of Birth: 1950-12-13   Important Message Given:  Yes - Medicare IM     Wynonia Hedges 06/16/2023, 4:54 PM

## 2023-06-16 NOTE — Plan of Care (Signed)
  Problem: Education: Goal: Ability to describe self-care measures that may prevent or decrease complications (Diabetes Survival Skills Education) will improve Outcome: Progressing   Problem: Nutritional: Goal: Maintenance of adequate nutrition will improve Outcome: Progressing   Problem: Education: Goal: Knowledge of General Education information will improve Description: Including pain rating scale, medication(s)/side effects and non-pharmacologic comfort measures Outcome: Progressing   Problem: Health Behavior/Discharge Planning: Goal: Ability to manage health-related needs will improve Outcome: Progressing   Problem: Clinical Measurements: Goal: Respiratory complications will improve Outcome: Progressing   Problem: Nutrition: Goal: Adequate nutrition will be maintained Outcome: Progressing   Problem: Coping: Goal: Level of anxiety will decrease Outcome: Progressing   Problem: Elimination: Goal: Will not experience complications related to bowel motility Outcome: Progressing   Problem: Elimination: Goal: Will not experience complications related to urinary retention Outcome: Progressing   Problem: Pain Managment: Goal: General experience of comfort will improve and/or be controlled Outcome: Progressing

## 2023-06-16 NOTE — TOC Progression Note (Addendum)
 Transition of Care [facility]) - Progression Note    Patient Details  Name: Debbie Wiggins MRN: [id number] Date of Birth: 10-14-1950  [facility]) CM/SW Contact  Tye Juarez A [place], LCSW Phone Number: 06/16/2023, 11:34 AM  Clinical Narrative:     Update 1650 Pt said she has not spoken with family, still needs time to decide on bed offers. CSW to follow up tomorrow.   CSW met with pt at bedside to provide bed offers with Medicare.gov ratings. She stated she would discuss with family and make a decision on a facility and let CSW know. CSW contact information provided.   TOC will continue to follow.    Expected Discharge Plan: Skilled Nursing Facility Barriers to Discharge: Continued Medical Work up, SNF Pending bed offer, Insurance Authorization  Expected Discharge Plan and Services       Living arrangements for the past 2 months: Skilled Nursing Facility                                       Social Determinants of Health (SDOH) Interventions SDOH Screenings   Food Insecurity: Food Insecurity Present (06/12/2023)  Housing: Low Risk  (06/12/2023)  Transportation Needs: Unmet Transportation Needs (06/12/2023)  Utilities: Not At Risk (06/12/2023)  Financial Resource Strain: Patient Declined (10/12/2022)   Received from [facility]  Physical Activity: Unknown (12/08/2017)   Received from [facility], [facility]  Social Connections: Moderately Integrated (06/12/2023)  Tobacco Use: Low Risk  (06/12/2023)    Readmission Risk Interventions     No data to display

## 2023-06-17 DIAGNOSIS — L03116 Cellulitis of left lower limb: Secondary | ICD-10-CM | POA: Diagnosis not present

## 2023-06-17 DIAGNOSIS — L89500 Pressure ulcer of unspecified ankle, unstageable: Secondary | ICD-10-CM | POA: Diagnosis not present

## 2023-06-17 DIAGNOSIS — I872 Venous insufficiency (chronic) (peripheral): Secondary | ICD-10-CM | POA: Diagnosis not present

## 2023-06-17 DIAGNOSIS — L304 Erythema intertrigo: Secondary | ICD-10-CM | POA: Diagnosis not present

## 2023-06-17 LAB — BASIC METABOLIC PANEL WITH GFR
Anion gap: 5 (ref 5–15)
BUN: 34 mg/dL — ABNORMAL HIGH (ref 8–23)
CO2: 25 mmol/L (ref 22–32)
Calcium: 8.5 mg/dL — ABNORMAL LOW (ref 8.9–10.3)
Chloride: 110 mmol/L (ref 98–111)
Creatinine, Ser: 1.37 mg/dL — ABNORMAL HIGH (ref 0.44–1.00)
GFR, Estimated: 41 mL/min — ABNORMAL LOW (ref >60)
Glucose, Bld: 153 mg/dL — ABNORMAL HIGH (ref 70–99)
Potassium: 4 mmol/L (ref 3.5–5.1)
Sodium: 140 mmol/L (ref 135–145)

## 2023-06-17 LAB — CBC
HCT: 39.5 % (ref 36.0–46.0)
Hemoglobin: 12 g/dL (ref 12.0–15.0)
MCH: 31.1 pg (ref 26.0–34.0)
MCHC: 30.4 g/dL (ref 30.0–36.0)
MCV: 102.3 fL — ABNORMAL HIGH (ref 80.0–100.0)
Platelets: 212 10*3/uL (ref 150–400)
RBC: 3.86 MIL/uL — ABNORMAL LOW (ref 3.87–5.11)
RDW: 14.1 % (ref 11.5–15.5)
WBC: 9.1 10*3/uL (ref 4.0–10.5)
nRBC: 0 % (ref 0.0–0.2)

## 2023-06-17 LAB — GLUCOSE, CAPILLARY
Glucose-Capillary: 159 mg/dL — ABNORMAL HIGH (ref 70–99)
Glucose-Capillary: 164 mg/dL — ABNORMAL HIGH (ref 70–99)
Glucose-Capillary: 140 mg/dL — ABNORMAL HIGH (ref 70–99)
Glucose-Capillary: 133 mg/dL — ABNORMAL HIGH (ref 70–99)

## 2023-06-17 LAB — CULTURE, BLOOD (ROUTINE X 2)
Culture: NO GROWTH
Culture: NO GROWTH

## 2023-06-17 MED ORDER — LINEZOLID 600 MG PO TABS
600.0000 mg | ORAL_TABLET | Freq: Two times a day (BID) | ORAL | Status: AC
  Administered 2023-06-17 – 2023-06-18 (×4): 600 mg via ORAL
  Filled 2023-06-17 (×4): qty 1

## 2023-06-17 NOTE — H&P (Deleted)
 Overnight events: None  Subjective: Patient is evaluated at bedside. Reports that she was able to sleep last night with the air mattress. Reports that she feels less pressure at the wound site (LLE). Denies any pruritus on the abdomen. Reports mild headache. No LLE pain. Wound was undressed, picture is taken.   Objective:  Vital signs in last 24 hours: Vitals:   06/16/23 1700 06/16/23 1956 06/17/23 0541 06/17/23 0822  BP: 128/60 (!) 128/58 (!) 114/52 (!) 120/55  Pulse: 70 70 74 69  Resp:  16 16 17   Temp:  99.5 F (37.5 C) 98 F (36.7 C) 97.7 F (36.5 C)  TempSrc:  Oral  Oral  SpO2:  97% 95% 100%  Weight:      Height:       Physical Exam: Constitutional: Chronically ill-appearing older woman, sitting up in bed in no distress Cardio: Regular rate and rhythm, no murmurs appreciated Pulm: Normal respiratory rate and effort Skin: Legs wrapped in dressing; dressing removed to visualize left leg, see image below; no tenderness to palpation in the left leg wound region Neuro: Alert and oriented x 3, no focal neuro deficits       Labs: CBC, BMP at baseline Macrocytosis, can have outpatient workup Glucose 159  Assessment/Plan: Principal Problem:   Fall at home, initial encounter Active Problems:   Elevated CK   Physical deconditioning   Weakness generalized   Cellulitis of leg, left   Ulcer of perineum (HCC)   Renal mass   Cellulitis  Patient Summary: Debbie Wiggins is a 73 y.o. person living with a history of falls with poor functional status who presented after being found down at home and is admitted for recurrent falls and left leg cellulitis on hospital day 5.  Ground-level fall Physical deconditioning Polypharmacy While she has been hospitalized, her blood sugars have been relatively well-controlled on just sliding scale insulin  and her blood pressure has been normal to low without any medications, so she likely does not need the several antihypertensive agents she  has been on and that the blood sugar medications she was on at home.  I wonder if she had a combination of hypotension and hypoglycemia at home, contributing to her falls and general weakness.  We will discontinue most of these medications at discharge.  From a medical standpoint, she is medically ready for discharge to a SNF facility, pending a bed offer and insurance authorization.  She says she is going to work on choosing a facility today and will speak with her siblings, who are her caregivers, about which one to choose. - Continue PT/OT - SNF placement pending, TOC assistance appreciated - Holding home BP and antiglycemic agents  Bradycardia Bradycardia has improved following discontinuation of the Coreg.  Will continue to hold this medication for now as her blood pressure is also normal to low.  Left leg cellulitis Venous stasis dermatitis Intertrigo Pressure injuries She was able to get an air mattress yesterday, which has greatly improved her wound pain.  She is still having some wound pain but feels like it is improving.  We unwrapped her dressing today to take a look at the area of cellulitis, which still seems erythematous with chronic skin changes (new pic in media tab).  While her cellulitis looks better, we will be conservative and extend the antibiotic course given the appearance of her leg wound. - Linezolid 600 mg twice daily for 2 more days - Wound care per wound team; will continue at discharge - Nystatin  powder BID  Sick sinus syndrome Left bundle branch block Prolonged Qtc No new changes.  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 We provided her with education about the pacemaker and she is agreeable to follow up with her previous cardiologist, Dr. Parks Bollman.   Chronic conditions: T2DM: A1c 6.7%. Moderate SSI TID, at bedtime.  Holding pioglitazone, insulin .  Fasting blood sugar in 150s this morning. HTN: Holding home  medications: Carvedilol 6.25mg  BID, valsartan, chlorthalidone. Gout: Continue home allopurinol HLD: continue home rosuvastatin Insomnia: continue home trazodone 50mg  at bedtime Chronic pain: continue home duloxetine 30mg   Renal stones/cysts: Reportedly chronic per patient for ~35 years. Occasionally has stones pass, but usually passes them easily with minimal pain. Follows with nephrology outpatient. No urinary symptoms currently. Nothing to do per urology. Renal ultrasound unable to visualize these structures well.   Diet: HH/CM IVF: None VTE: Enoxaparin Code: Full   Dispo: Anticipated discharge to  SNF  pending medical stability and safe dispo plan.   Dorthy Gavia, MD 06/17/2023, 11:58 AM After 5pm on weekdays and 1pm on weekends: On Call pager [phone]

## 2023-06-17 NOTE — Plan of Care (Signed)

## 2023-06-17 NOTE — TOC Progression Note (Signed)
 Transition of Care [facility]) - Progression Note    Patient Details  Name: Debbie Wiggins MRN: [id number] Date of Birth: 05-May-1950  [facility]) CM/SW Contact  Berea Majkowski A [place], LCSW Phone Number: 06/17/2023, 3:45 PM  Clinical Narrative:     CSW met with pt at bedside. She said that she would like more options in the [place] area.   Discussed that preference for bed offers at [facility] and [facility] has declined pt so unable to pursue those preference. Requested [facility], informed pt facility does not usually take outside referrals, however CSW followed up and while they do have openings, they are not in-network with pt's insurance.   CSW to follow up with pt regarding updated bed offers and decision for SNF.   SW to start insurance authorization as pt is near medical stability.   TOC will continue to follow.   Expected Discharge Plan: Skilled Nursing Facility Barriers to Discharge: Continued Medical Work up, SNF Pending bed offer, Insurance Authorization  Expected Discharge Plan and Services       Living arrangements for the past 2 months: Skilled Nursing Facility                                       Social Determinants of Health (SDOH) Interventions SDOH Screenings   Food Insecurity: Food Insecurity Present (06/12/2023)  Housing: Low Risk  (06/12/2023)  Transportation Needs: Unmet Transportation Needs (06/12/2023)  Utilities: Not At Risk (06/12/2023)  Financial Resource Strain: Patient Declined (10/12/2022)   Received from [facility]  Physical Activity: Unknown (12/08/2017)   Received from [facility], [facility]  Social Connections: Moderately Integrated (06/12/2023)  Tobacco Use: Low Risk  (06/12/2023)    Readmission Risk Interventions     No data to display

## 2023-06-17 NOTE — Progress Notes (Signed)
 Overnight events: None  Subjective: Patient is evaluated at bedside. Reports that she was able to sleep last night with the air mattress. Reports that she feels less pressure at the wound site (LLE). Denies any pruritus on the abdomen. Reports mild headache. No LLE pain. Wound was undressed, picture is taken.   Objective:  Vital signs in last 24 hours: Vitals:   06/16/23 1700 06/16/23 1956 06/17/23 0541 06/17/23 0822  BP: 128/60 (!) 128/58 (!) 114/52 (!) 120/55  Pulse: 70 70 74 69  Resp:  16 16 17   Temp:  99.5 F (37.5 C) 98 F (36.7 C) 97.7 F (36.5 C)  TempSrc:  Oral  Oral  SpO2:  97% 95% 100%  Weight:      Height:       Physical Exam: Constitutional: Chronically ill-appearing older woman, sitting up in bed in no distress Cardio: Regular rate and rhythm, no murmurs appreciated Pulm: Normal respiratory rate and effort Skin: Legs wrapped in dressing; dressing removed to visualize left leg, see image below; no tenderness to palpation in the left leg wound region Neuro: Alert and oriented x 3, no focal neuro deficits       Labs: CBC, BMP at baseline Macrocytosis, can have outpatient workup Glucose 159  Assessment/Plan: Principal Problem:   Fall at home, initial encounter Active Problems:   Elevated CK   Physical deconditioning   Weakness generalized   Cellulitis of leg, left   Ulcer of perineum (HCC)   Renal mass   Cellulitis  Patient Summary: Debbie Wiggins is a 73 y.o. person living with a history of falls with poor functional status who presented after being found down at home and is admitted for recurrent falls and left leg cellulitis on hospital day 5.  Ground-level fall Physical deconditioning Polypharmacy While she has been hospitalized, her blood sugars have been relatively well-controlled on just sliding scale insulin  and her blood pressure has been normal to low without any medications, so she likely does not need the several antihypertensive agents she  has been on and that the blood sugar medications she was on at home.  I wonder if she had a combination of hypotension and hypoglycemia at home, contributing to her falls and general weakness.  We will discontinue most of these medications at discharge.  From a medical standpoint, she is medically ready for discharge to a SNF facility, pending a bed offer and insurance authorization.  She says she is going to work on choosing a facility today and will speak with her siblings, who are her caregivers, about which one to choose. - Continue PT/OT - SNF placement pending, TOC assistance appreciated - Holding home BP and antiglycemic agents  Bradycardia Bradycardia has improved following discontinuation of the Coreg.  Will continue to hold this medication for now as her blood pressure is also normal to low.  Left leg cellulitis Venous stasis dermatitis Intertrigo Pressure injuries She was able to get an air mattress yesterday, which has greatly improved her wound pain.  She is still having some wound pain but feels like it is improving.  We unwrapped her dressing today to take a look at the area of cellulitis, which still seems erythematous with chronic skin changes (new pic in media tab).  While her cellulitis looks better, we will be conservative and extend the antibiotic course given the appearance of her leg wound. - Linezolid 600 mg twice daily for 2 more days - Wound care per wound team; will continue at discharge - Nystatin  powder BID  Sick sinus syndrome Left bundle branch block Prolonged Qtc No new changes.  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 We provided her with education about the pacemaker and she is agreeable to follow up with her previous cardiologist, Dr. Parks Bollman.   Chronic conditions: T2DM: A1c 6.7%. Moderate SSI TID, at bedtime.  Holding pioglitazone, insulin .  Fasting blood sugar in 150s this morning. HTN: Holding home  medications: Carvedilol 6.25mg  BID, valsartan, chlorthalidone. Gout: Continue home allopurinol HLD: continue home rosuvastatin Insomnia: continue home trazodone 50mg  at bedtime Chronic pain: continue home duloxetine 30mg   Renal stones/cysts: Reportedly chronic per patient for ~35 years. Occasionally has stones pass, but usually passes them easily with minimal pain. Follows with nephrology outpatient. No urinary symptoms currently. Nothing to do per urology. Renal ultrasound unable to visualize these structures well.   Diet: HH/CM IVF: None VTE: Enoxaparin Code: Full   Dispo: Anticipated discharge to  SNF  pending medical stability and safe dispo plan.   Dorthy Gavia, MD 06/17/2023, 11:58 AM After 5pm on weekdays and 1pm on weekends: On Call pager [phone]

## 2023-06-18 DIAGNOSIS — L304 Erythema intertrigo: Secondary | ICD-10-CM | POA: Diagnosis not present

## 2023-06-18 DIAGNOSIS — L03116 Cellulitis of left lower limb: Secondary | ICD-10-CM | POA: Diagnosis not present

## 2023-06-18 DIAGNOSIS — L89500 Pressure ulcer of unspecified ankle, unstageable: Secondary | ICD-10-CM | POA: Diagnosis not present

## 2023-06-18 DIAGNOSIS — I872 Venous insufficiency (chronic) (peripheral): Secondary | ICD-10-CM | POA: Diagnosis not present

## 2023-06-18 LAB — CBC
HCT: 42.3 % (ref 36.0–46.0)
Hemoglobin: 13.4 g/dL (ref 12.0–15.0)
MCH: 30.9 pg (ref 26.0–34.0)
MCHC: 31.7 g/dL (ref 30.0–36.0)
MCV: 97.5 fL (ref 80.0–100.0)
Platelets: 250 10*3/uL (ref 150–400)
RBC: 4.34 MIL/uL (ref 3.87–5.11)
RDW: 13.8 % (ref 11.5–15.5)
WBC: 11.7 10*3/uL — ABNORMAL HIGH (ref 4.0–10.5)
nRBC: 0 % (ref 0.0–0.2)

## 2023-06-18 LAB — GLUCOSE, CAPILLARY
Glucose-Capillary: 178 mg/dL — ABNORMAL HIGH (ref 70–99)
Glucose-Capillary: 217 mg/dL — ABNORMAL HIGH (ref 70–99)
Glucose-Capillary: 154 mg/dL — ABNORMAL HIGH (ref 70–99)
Glucose-Capillary: 174 mg/dL — ABNORMAL HIGH (ref 70–99)

## 2023-06-18 NOTE — Progress Notes (Signed)
 Overnight events: None  Subjective: Patient is evaluated at bedside. Reports that she slept well last night. Reports that she needs to look at different facilities in [place] near her caregivers before deciding the exact location. States that she still wants to go to SNF rather than home with [facility].   Objective:  Vital signs in last 24 hours: Vitals:   06/16/23 1700 06/16/23 1956 06/17/23 0541 06/17/23 0822  BP: 128/60 (!) 128/58 (!) 114/52 (!) 120/55  Pulse: 70 70 74 69  Resp:  16 16 17   Temp:  99.5 F (37.5 C) 98 F (36.7 C) 97.7 F (36.5 C)  TempSrc:  Oral  Oral  SpO2:  97% 95% 100%  Weight:      Height:       Physical Exam: Constitutional: Chronically ill-appearing older woman, sitting up in the chair in no distress Cardio: Regular rate and rhythm, no murmurs appreciated; no LE edema Pulm: Normal respiratory rate and effort Skin: Left leg wrapped in ace bandage, clean and dry Neuro: Alert and oriented x 3, no focal neuro deficits  Labs: WBC 11.7  Assessment/Plan: Principal Problem:   Fall at home, initial encounter Active Problems:   Elevated CK   Physical deconditioning   Weakness generalized   Cellulitis of leg, left   Ulcer of perineum (HCC)   Renal mass   Cellulitis  Patient Summary: Debbie Wiggins is a 73 y.o. person living with a history of falls with poor functional status who presented after being found down at home and is admitted for recurrent falls and left leg cellulitis on hospital day 6.  Ground-level fall Physical deconditioning Polypharmacy No new concerns. Stable from a medical standpoint for discharge to SNF. Unfortunately had a few SNF denials. She says she is looking to pick a new SNF option in [place], near her siblings who are caregivers. We will touch base with TOC about helping her make this decision before the weekend.  - Continue PT/OT - SNF placement pending, TOC assistance appreciated - Discontinuing a lot of her antiglycemic and  antihypertensive agents at discharge; suspect she has been hypoglycemia and hypotensive at home; has not needed most of these during hospitalization.   Left leg cellulitis Venous stasis dermatitis Intertrigo Pressure injuries Stable, no acute issues. Afebrile, mild leukocytosis. Hemodynamically stable. No mention of leg pain this morning.  - Last day of linezolid today - Wound care per wound team; will continue at discharge - Nystatin powder BID - Will recheck CBC in am  Sick sinus syndrome Left bundle branch block Prolonged Qtc No new changes.  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 We provided her with education about the pacemaker and she is agreeable to follow up with her previous cardiologist, Dr. Parks Bollman.   Chronic conditions: T2DM: A1c 6.7%. Moderate SSI TID, at bedtime.  Holding pioglitazone, home insulin .  Fasting blood sugar in 170s this morning. HTN: Holding home medications: Carvedilol 6.25mg  BID, valsartan, chlorthalidone. BP wnl. Will d/c at discharge.  Gout: Continue home allopurinol HLD: continue home rosuvastatin Insomnia: continue home trazodone 50mg  at bedtime Chronic pain: continue home duloxetine 30mg   Renal stones/cysts: Reportedly chronic per patient for ~35 years. Occasionally has stones pass, but usually passes them easily with minimal pain. Follows with nephrology outpatient. No urinary symptoms currently. Nothing to do per urology. Renal ultrasound unable to visualize these structures well.   Diet: HH/CM IVF: None VTE: Enoxaparin Code: Full   Dispo: Medically stable for discharge  to  SNF  pending SNF placement.   Dorthy Gavia, MD 06/17/2023, 11:58 AM After 5pm on weekdays and 1pm on weekends: On Call pager [phone]

## 2023-06-18 NOTE — Plan of Care (Signed)

## 2023-06-18 NOTE — Progress Notes (Signed)
 Physical Therapy Treatment Patient Details Name: Debbie Wiggins MRN: [id number] DOB: 1950/07/31 Today's Date: 06/18/2023   History of Present Illness 73 yo female adm 06/12/23 after fall on 5/22 and on ground since then. Pt with UTI, LLE cellulitis with venous statis ulcers, and sacral ulcer. PMHx: T2DM, HTN, CKD, CAD, PPM, gout, neuropathy, and frequent falls.    PT Comments  Pt pleasant and very willing to mobilize with progression to standing with stedy and short bouts of gait with +2 assist. Pt struggles with sit<>stand transfers with weakness and pain but educated for continued trials with nursing and OOB daily with lift equipment. Will continue to follow. Patient will benefit from continued inpatient follow up therapy, <3 hours/day      If plan is discharge home, recommend the following: Two people to help with walking and/or transfers;Two people to help with bathing/dressing/bathroom;Assistance with cooking/housework;Direct supervision/assist for financial management;Direct supervision/assist for medications management;Assist for transportation;Assistance with feeding;Help with stairs or ramp for entrance   Can travel by private vehicle     No  Equipment Recommendations  Wheelchair (measurements PT);Wheelchair cushion (measurements PT);Other (comment)    Recommendations for Other Services       Precautions / Restrictions Precautions Precautions: Fall Recall of Precautions/Restrictions: Intact Precaution/Restrictions Comments: LLE and sacral wounds, sore Rt shoulder     Mobility  Bed Mobility Overal bed mobility: Needs Assistance Bed Mobility: Rolling, Sidelying to Sit Rolling: Min assist Sidelying to sit: Mod assist, Used rails, HOB elevated       General bed mobility comments: min assist to roll to right, mod assist to bring trunk off surface and pivot to EOB    Transfers Overall transfer level: Needs assistance   Transfers: Sit to/from Stand, Bed to  chair/wheelchair/BSC Sit to Stand: Mod assist, +2 physical assistance, Via lift equipment, From elevated surface           General transfer comment: at EOB with use of stedy and elevated bed pt able to stand to stedy with min +2 assist with cues for sequence, hand placement and posture. Min +2 to rise from stedy pad. Pt then stood x 3 from recliner with mod +2 assist, cues for sequence and physical assist of pad at sacrum to clear surface Transfer via Lift Equipment: Stedy  Ambulation/Gait Ambulation/Gait assistance: Min assist, +2 safety/equipment Gait Distance (Feet): 5 Feet Assistive device: Rolling walker (2 wheels) Gait Pattern/deviations: Step-through pattern, Decreased stride length, Trunk flexed   Gait velocity interpretation: <1.8 ft/sec, indicate of risk for recurrent falls   General Gait Details: cues for posture and safety with close chair follow. Pt able to walk 5' x 2 trials   Stairs             Wheelchair Mobility     Tilt Bed    Modified Rankin (Stroke Patients Only)       Balance Overall balance assessment: Needs assistance Sitting-balance support: Feet supported, Single extremity supported Sitting balance-Leahy Scale: Fair Sitting balance - Comments: EOB with single UE support   Standing balance support: Bilateral upper extremity supported, During functional activity, Reliant on assistive device for balance Standing balance-Leahy Scale: Poor Standing balance comment: heavy reliance on external support                            Communication Communication Communication: No apparent difficulties  Cognition Arousal: Alert Behavior During Therapy: WFL for tasks assessed/performed   PT - Cognitive impairments: Safety/Judgement  Following commands: Intact      Cueing Cueing Techniques: Verbal cues, Gestural cues, Tactile cues  Exercises      General Comments        Pertinent Vitals/Pain  Pain Assessment Pain Score: 2  Pain Location: LLE and Rt shoulder Pain Descriptors / Indicators: Discomfort, Grimacing, Sore Pain Intervention(s): Limited activity within patient's tolerance, Monitored during session, RN gave pain meds during session, Repositioned    Home Living                          Prior Function            PT Goals (current goals can now be found in the care plan section) Progress towards PT goals: Progressing toward goals    Frequency    Min 2X/week      PT Plan      Co-evaluation              AM-PAC PT "6 Clicks" Mobility   Outcome Measure  Help needed turning from your back to your side while in a flat bed without using bedrails?: A Little Help needed moving from lying on your back to sitting on the side of a flat bed without using bedrails?: A Lot Help needed moving to and from a bed to a chair (including a wheelchair)?: Total Help needed standing up from a chair using your arms (e.g., wheelchair or bedside chair)?: Total Help needed to walk in hospital room?: Total Help needed climbing 3-5 steps with a railing? : Total 6 Click Score: 9    End of Session Equipment Utilized During Treatment: Gait belt Activity Tolerance: Patient tolerated treatment well Patient left: in chair;with call [place]/phone within reach;with chair alarm set Nurse Communication: Mobility status;Need for lift equipment;Precautions;Other (comment) PT Visit Diagnosis: Unsteadiness on feet (R26.81);Other abnormalities of gait and mobility (R26.89);Muscle weakness (generalized) (M62.81);Pain     Time: 0752-0824 PT Time Calculation (min) (ACUTE ONLY): 32 min  Charges:    $Therapeutic Activity: 23-37 mins PT General Charges $$ ACUTE PT VISIT: 1 Visit                     Debbie Wiggins, PT Acute Rehabilitation Services Office: [phone]    Debbie Wiggins 06/18/2023, 10:43 AM

## 2023-06-18 NOTE — TOC Progression Note (Addendum)
 Transition of Care [facility]) - Progression Note    Patient Details  Name: Debbie Wiggins MRN: [id number] Date of Birth: [birth year]  [facility]) CM/SW Contact  Arelene Moroni A [place], LCSW Phone Number: 06/18/2023, 11:08 AM  Clinical Narrative:     Update 1617 CSW met with pt at bedside. Pt now agreeable to DC to [facility] for SNF as [facility] declined pt for bed offer. CSW reached out to [facility], said bed available Monday but reach out over the weekend for possible weekend admission. Point of contact for admission over weekend is [facility],  [phone]. Authorization started, status pending. Cesar Collins [id number]    TOC will continue to follow.   CSW met with pt at bedside and provided updated bed offers with Medicare ratings. She said that she wanted to speak with her family regarding her choice, discussed her choice about placement and provided supportive counseling on choice, as she has some concerns about going to SNF.   CSW reached out to pt's sister [name], provided bed offers update. She said that she was supporting pt but ultimately it was her decision regarding placement.   CSW to start insurance authorization as pt is medically stable. CSW to follow up at bedside at a later time to get decision from pt.    TOC will continue to follow.   Expected Discharge Plan: Skilled Nursing Facility Barriers to Discharge: Continued Medical Work up, SNF Pending bed offer, Insurance Authorization  Expected Discharge Plan and Services       Living arrangements for the past 2 months: Skilled Nursing Facility                                       Social Determinants of Health (SDOH) Interventions SDOH Screenings   Food Insecurity: Food Insecurity Present (06/12/2023)  Housing: Low Risk  (06/12/2023)  Transportation Needs: Unmet Transportation Needs (06/12/2023)  Utilities: Not At Risk (06/12/2023)  Financial Resource Strain: Patient Declined (10/12/2022)   Received  from [facility]  Physical Activity: Unknown (12/08/2017)   Received from [facility], [facility]  Social Connections: Moderately Integrated (06/12/2023)  Tobacco Use: Low Risk  (06/12/2023)    Readmission Risk Interventions     No data to display

## 2023-06-19 LAB — GLUCOSE, CAPILLARY
Glucose-Capillary: 153 mg/dL — ABNORMAL HIGH (ref 70–99)
Glucose-Capillary: 191 mg/dL — ABNORMAL HIGH (ref 70–99)
Glucose-Capillary: 165 mg/dL — ABNORMAL HIGH (ref 70–99)
Glucose-Capillary: 174 mg/dL — ABNORMAL HIGH (ref 70–99)

## 2023-06-19 LAB — CBC
HCT: 37.5 % (ref 36.0–46.0)
Hemoglobin: 11.9 g/dL — ABNORMAL LOW (ref 12.0–15.0)
MCH: 30.9 pg (ref 26.0–34.0)
MCHC: 31.7 g/dL (ref 30.0–36.0)
MCV: 97.4 fL (ref 80.0–100.0)
Platelets: 199 10*3/uL (ref 150–400)
RBC: 3.85 MIL/uL — ABNORMAL LOW (ref 3.87–5.11)
RDW: 13.8 % (ref 11.5–15.5)
WBC: 9.2 10*3/uL (ref 4.0–10.5)
nRBC: 0 % (ref 0.0–0.2)

## 2023-06-19 NOTE — Progress Notes (Addendum)
  Overnight events: None  Subjective: Patient is evaluated at bedside. Reports slept well last night. Denies any acute concerns. Awaiting on SNF auth.   Objective:  Vital signs in last 24 hours: Vitals:   06/16/23 1700 06/16/23 1956 06/17/23 0541 06/17/23 0822  BP: 128/60 (!) 128/58 (!) 114/52 (!) 120/55  Pulse: 70 70 74 69  Resp:  16 16 17   Temp:  99.5 F (37.5 C) 98 F (36.7 C) 97.7 F (36.5 C)  TempSrc:  Oral  Oral  SpO2:  97% 95% 100%  Weight:      Height:       Physical Exam: Constitutional: Chronically ill-appearing older woman, laying in bed comfortably, no acute distress Cardio: Regular rate and rhythm, no LE edema Pulm: Normal respiratory rate and effort Skin: Left leg wrapped in ace bandage, clean/dry/intact Neuro: Alert and oriented x 3, no focal neuro deficits  Labs: WBC 11.7>>9.2 Hgb stable 11.9  Assessment/Plan: Principal Problem:   Fall at home, initial encounter Active Problems:   Elevated CK   Physical deconditioning   Weakness generalized   Cellulitis of leg, left   Ulcer of perineum (HCC)   Renal mass   Cellulitis  Patient Summary: Debbie Wiggins is a 73 y.o. person living with a history of falls with poor functional status who presented after being found down at home and is admitted for recurrent falls and left leg cellulitis on hospital day 6.  Ground-level fall Physical deconditioning Polypharmacy No acute/new concerns. Medically stable for discharge to SNF. Insurance auth started by SW for [employer].   - Continue PT/OT - SNF placement pending, TOC assistance appreciated - Discontinuing a lot of her antiglycemic and antihypertensive agents at discharge; suspect she has been hypoglycemia and hypotensive at home; has not needed most of these during hospitalization.   Left leg cellulitis Venous stasis dermatitis Intertrigo Pressure injuries Stable, no acute issues. Afebrile, resolved leukocytosis. Hemodynamically stable. No mention of leg  pain this morning.  - Completed 7 days of Linezolid   - Wound care per wound team; will continue at discharge - Nystatin  powder BID  Sick sinus syndrome Left bundle branch block Prolonged Qtc No new changes.  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 We provided her with education about the pacemaker and she is agreeable to follow up with her previous cardiologist, Dr. Parks Bollman.   Chronic conditions: T2DM: A1c 6.7%. On home Farxiga . Moderate SSI TID ACHS.  HOLD  home pioglitazone and long acting insulin . Fasting CBG 153, at goal this morning. Will likely just continue with Farxiga  at d/c.  HTN: HOLD home medications: Carvedilol  6.25mg  BID, valsartan, chlorthalidone. BP normotensive. Will d/c at discharge.  CKD 3B: Stable. At baseline of Scr 1.4-1.6. Gout: Continue home allopurinol  HLD: continue home rosuvastatin  Insomnia: continue home trazodone  50mg  at bedtime Chronic pain: continue home duloxetine  30mg   Renal stones/cysts: Reportedly chronic per patient for ~35 years. Occasionally has stones pass, but usually passes them easily with minimal pain. Follows with nephrology outpatient. No urinary symptoms currently. Nothing to do per inpatient urology. Renal ultrasound unable to visualize these structures well.   Diet: HH/CM IVF: None VTE: Enoxaparin  Code: Full   Dispo: Medically stable for discharge to  SNF  pending SNF placement.   Styles Fambro, DO Internal Medicine Resident PGY-2 Please contact the on-call pager after 5 pm and on weekends at [phone].

## 2023-06-19 NOTE — Progress Notes (Signed)
 Took over care from [place], LPN, agree with her assessment

## 2023-06-19 NOTE — Plan of Care (Signed)

## 2023-06-19 NOTE — Progress Notes (Signed)
 Psychosocial Progressive/Outcome: ANOx4, calm and cooperative  Family at bedside   Pain/Comfort Progression/Outcome: Pt did not complain about pain during shift   Clinical Progression/Outcome: Adequate fluid and diet intake  Turned and repositioned during shift  Voids to purwick  No BM Max assist  Slept in between care Maintained safety

## 2023-06-20 DIAGNOSIS — I872 Venous insufficiency (chronic) (peripheral): Secondary | ICD-10-CM | POA: Diagnosis present

## 2023-06-20 DIAGNOSIS — Z79899 Other long term (current) drug therapy: Secondary | ICD-10-CM

## 2023-06-20 LAB — GLUCOSE, CAPILLARY
Glucose-Capillary: 167 mg/dL — ABNORMAL HIGH (ref 70–99)
Glucose-Capillary: 203 mg/dL — ABNORMAL HIGH (ref 70–99)
Glucose-Capillary: 136 mg/dL — ABNORMAL HIGH (ref 70–99)
Glucose-Capillary: 196 mg/dL — ABNORMAL HIGH (ref 70–99)

## 2023-06-20 MED ORDER — SENNA 8.6 MG PO TABS
1.0000 | ORAL_TABLET | Freq: Every day | ORAL | Status: DC
  Administered 2023-06-20 – 2023-06-21 (×2): 8.6 mg via ORAL
  Filled 2023-06-20 (×2): qty 1

## 2023-06-20 NOTE — TOC Progression Note (Signed)
 Transition of Care [facility]) - Progression Note    Patient Details  Name: Caroleen Stoermer MRN: [id number] Date of Birth: 1950/06/26  Transition of Care [facility]) CM/SW Contact  Paullette Boston [place], [place] Phone Number: 06/20/2023, 11:47 AM  Clinical Narrative:  Per MD, pt stable for dc. Home [employer] Community/[employer] auth received: #[id number], valid through 6/3. Spoke to Tammy with [facility] who reports they are not able to accept pt until Monday. MD made aware of barrier to dc.   Paullette Boston, MSW, LCSW [phone] (coverage)       Expected Discharge Plan: Skilled Nursing Facility Barriers to Discharge: Continued Medical Work up, SNF Pending bed offer, Insurance Authorization  Expected Discharge Plan and Services       Living arrangements for the past 2 months: Skilled Nursing Facility                                       Social Determinants of Health (SDOH) Interventions SDOH Screenings   Food Insecurity: Food Insecurity Present (06/12/2023)  Housing: Low Risk  (06/12/2023)  Transportation Needs: Unmet Transportation Needs (06/12/2023)  Utilities: Not At Risk (06/12/2023)  Financial Resource Strain: Patient Declined (10/12/2022)   Received from [facility]  Physical Activity: Unknown (12/08/2017)   Received from [facility], [facility]  Social Connections: Moderately Integrated (06/12/2023)  Tobacco Use: Low Risk  (06/12/2023)    Readmission Risk Interventions     No data to display

## 2023-06-20 NOTE — Plan of Care (Signed)
   Problem: Education: Goal: Ability to describe self-care measures that may prevent or decrease complications (Diabetes Survival Skills Education) will improve Outcome: Progressing Goal: Individualized Educational Video(s) Outcome: Progressing

## 2023-06-20 NOTE — Plan of Care (Signed)

## 2023-06-20 NOTE — Progress Notes (Addendum)
  Overnight events: None  Subjective: Patient is evaluated at bedside.  Feels like she is doing well this morning.  No acute concerns.  Objective:  Vital signs in last 24 hours: Vitals:   06/16/23 1700 06/16/23 1956 06/17/23 0541 06/17/23 0822  BP: 128/60 (!) 128/58 (!) 114/52 (!) 120/55  Pulse: 70 70 74 69  Resp:  16 16 17   Temp:  99.5 F (37.5 C) 98 F (36.7 C) 97.7 F (36.5 C)  TempSrc:  Oral  Oral  SpO2:  97% 95% 100%  Weight:      Height:       Physical Exam: Constitutional: Chronically ill-appearing older woman, laying in bed comfortably, no acute distress Cardio: Regular rate and rhythm Pulm: Normal respiratory rate and effort Skin: Left leg wrapped in ace bandage, dry and in tact; left leg with venous insufficiency ulceration and dermatitis Neuro: Alert and oriented x 3, no focal neuro deficits  Labs: No new labs  Assessment/Plan: Principal Problem:   Fall at home, initial encounter Active Problems:   Elevated CK   Physical deconditioning   Weakness generalized   Cellulitis of leg, left   Ulcer of perineum (HCC)   Renal mass   Cellulitis  Patient Summary: Debbie Wiggins is a 73 y.o. person living with a history of falls with poor functional status who presented after being found down at home and is admitted for recurrent falls and left leg cellulitis on hospital day 8.  Ground-level fall Physical deconditioning Polypharmacy No acute concerns.  Medically stable for discharge to SNF.  - Continue PT/OT - SNF placement pending, TOC assistance appreciated - Discontinuing a lot of her antiglycemic and antihypertensive agents at discharge; suspect she has been hypoglycemia and hypotensive at home; has not needed most of these during hospitalization.   Left leg cellulitis Venous insufficiency, with dermatitis and ulceration of L leg Intertrigo Pressure injuries She has completed 7 days of linezolid .  Stable, no acute concerns.  Remains afebrile. Continuing with  wound care per wound team, which we will continue at discharge. Nystatin  powder BID for intertrigo.   Sick sinus syndrome Left bundle branch block Prolonged Qtc No new changes.  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 We provided her with education about the pacemaker and she is agreeable to follow up with her previous cardiologist, Dr. Parks Bollman.   Chronic conditions: T2DM: A1c 6.7%. On home Farxiga . Moderate SSI TID ACHS.  HOLD  home pioglitazone and long acting insulin . Will likely just continue with Farxiga  at d/c.  HTN: HOLD home medications: Carvedilol  6.25mg  BID, valsartan, chlorthalidone. BP normotensive.  Will discontinue these medications at discharge. CKD 3B: Stable. Baseline of Scr 1.4-1.6. Gout: Continue home allopurinol  HLD: continue home rosuvastatin  Insomnia: continue home trazodone  50mg  at bedtime Chronic pain: continue home duloxetine  30mg   Renal stones/cysts: Reportedly chronic per patient for ~35 years. Occasionally has stones pass, but usually passes them easily with minimal pain. Follows with nephrology outpatient. No urinary symptoms currently. Nothing to do per inpatient urology. Renal ultrasound unable to visualize these structures well.   Diet: HH/CM IVF: None VTE: Enoxaparin  Code: Full   Dispo: Medically stable for discharge to  SNF  pending SNF placement.   Dorthy Gavia, MD Internal Medicine Resident PGY-1 Please contact the on-call pager after 5 pm and on weekends at [phone].

## 2023-06-21 DIAGNOSIS — L89500 Pressure ulcer of unspecified ankle, unstageable: Secondary | ICD-10-CM | POA: Diagnosis not present

## 2023-06-21 DIAGNOSIS — L304 Erythema intertrigo: Secondary | ICD-10-CM | POA: Diagnosis not present

## 2023-06-21 DIAGNOSIS — I872 Venous insufficiency (chronic) (peripheral): Secondary | ICD-10-CM | POA: Diagnosis not present

## 2023-06-21 DIAGNOSIS — L03116 Cellulitis of left lower limb: Secondary | ICD-10-CM | POA: Diagnosis not present

## 2023-06-21 DIAGNOSIS — L0390 Cellulitis, unspecified: Secondary | ICD-10-CM

## 2023-06-21 LAB — GLUCOSE, CAPILLARY
Glucose-Capillary: 184 mg/dL — ABNORMAL HIGH (ref 70–99)
Glucose-Capillary: 216 mg/dL — ABNORMAL HIGH (ref 70–99)

## 2023-06-21 MED ORDER — NYSTATIN 100000 UNIT/GM EX POWD: Freq: Two times a day (BID) | CUTANEOUS | Status: AC

## 2023-06-21 NOTE — Discharge Summary (Signed)
 Name: Debbie Wiggins MRN: [id number] DOB: [birth year] 73 y.o. PCP: Lyle San, MD  Date of Admission: 06/12/2023 12:06 PM Date of Discharge:  06/21/23 Attending Physician: Dr. Jarvis Mesa  DISCHARGE DIAGNOSIS:  Primary Problem: Fall at home, initial encounter   Hospital Problems: Principal Problem:   Fall at home, initial encounter Active Problems:   Physical deconditioning   Weakness generalized   Cellulitis of leg, left   Pressure ulcer of sacrum   Renal lesion   Ulcer of extremity due to chronic venous insufficiency (HCC)   Polypharmacy    DISCHARGE MEDICATIONS:   Allergies as of 06/21/2023       Reactions   Ceftin [cefuroxime Axetil] Swelling, Rash   Cefuroxime Swelling, Rash   Latex Swelling, Rash   Bandaids  No issues with balloons, gloves or elastic in underwear   Neosporin Original [bacitracin-neomycin-polymyxin] Swelling, Rash   Nickel Rash   Penicillins Swelling, Rash   Spironolactone    Patient stated that she felt weakness and lightheadness as well as cough, hoarseness in throat, sores developing in groin and back area and loss of sleep.   Sulfa Antibiotics Swelling, Rash   Tape Itching, Rash        Medication List     PAUSE taking these medications    carvedilol  6.25 MG tablet Wait to take this until your doctor or other care provider tells you to start again. Commonly known as: COREG  Take 6.25 mg by mouth 2 (two) times daily with a meal.   chlorthalidone 25 MG tablet Wait to take this until your doctor or other care provider tells you to start again. Commonly known as: HYGROTON Take 25 mg by mouth daily.   pioglitazone 15 MG tablet Wait to take this until your doctor or other care provider tells you to start again. Commonly known as: ACTOS Take 15 mg by mouth daily.   valsartan 160 MG tablet Wait to take this until your doctor or other care provider tells you to start again. Commonly known as: DIOVAN Take 160 mg by mouth daily.       TAKE  these medications    acetaminophen  325 MG tablet Commonly known as: TYLENOL  Take 650 mg by mouth every 6 (six) hours as needed.   allopurinol  100 MG tablet Commonly known as: ZYLOPRIM  Take 100 mg by mouth daily.   aspirin EC 81 MG tablet Take 81 mg by mouth daily.   BD Insulin  Syringe U/F 31G X 5/16" 0.3 ML Misc Generic drug: Insulin  Syringe-Needle U-100 2 (two) times daily.   Blood Pressure Cuff Misc by Does not apply route.   calcitRIOL  0.25 MCG capsule Commonly known as: ROCALTROL  Take 0.25 mcg by mouth every Monday, Wednesday, and Friday.   cholecalciferol 25 MCG (1000 UNIT) tablet Commonly known as: VITAMIN D3 Take 1,000 Units by mouth daily.   colchicine 0.6 MG tablet Take 0.6 mg by mouth daily as needed.   DULoxetine  30 MG capsule Commonly known as: CYMBALTA  Take 30 mg by mouth daily.   Exel Comfort Point Pen Needle 29G X Misc Generic drug: Insulin  Pen Needle Use as directed   Farxiga  10 MG Tabs tablet Generic drug: dapagliflozin  propanediol Take 10 mg by mouth daily.   Fifty50 Glucose Meter 2.0 w/Device Kit Use as directed (check blood glucose 3 times daily.)   gabapentin  100 MG capsule Commonly known as: NEURONTIN  Take 100 mg by mouth 3 (three) times daily.   HumuLIN 70/30 (70-30) 100 UNIT/ML injection Generic drug: insulin  NPH-regular Human  SMARTSIG:34 Unit(s) SUB-Q Twice Daily   latanoprost  0.005 % ophthalmic solution Commonly known as: XALATAN  Place 1 drop into both eyes at bedtime.   loratadine  10 MG tablet Commonly known as: CLARITIN  Take 10 mg by mouth daily as needed for allergies.   nystatin  powder Commonly known as: MYCOSTATIN /NYSTOP  Apply topically 2 (two) times daily.   OneTouch Delica Plus Lancet30G Misc USE  TID UTD   OneTouch Verio test strip Generic drug: glucose blood USE TID UTD   rosuvastatin  10 MG tablet Commonly known as: CRESTOR  Take 10 mg by mouth daily.   timolol  0.5 % ophthalmic solution Commonly known  as: TIMOPTIC  Place 1 drop into both eyes every morning.   traZODone  50 MG tablet Commonly known as: DESYREL  Take 50 mg by mouth at bedtime.               Discharge Care Instructions  (From admission, onward)           Start     Ordered   06/21/23 0000  Discharge wound care:       Comments: Cleanse B lower legs (intact skin and wounds) with Vashe wound cleanser Timm Foot [phone]) do not rinse and allow to air dry.  Apply Xeroform gauze to wound beds daily Timm Foot [phone]), cover with ABD pad and secure with Kerlix roll gauze starting right above toes and ending right below knees.   Apply Ace bandage wrapped in same fashion as Kerlix for light compression.   Cleanse L outer hip wound with Vashe, apply Xeroform Timm Foot [phone]) gauze daily and cover with silicone foam or ABD pad and tape.   06/21/23 1227            DISPOSITION AND FOLLOW-UP:  Ms.Marieann Vetsch was discharged from [facility] in stable condition. At the hospital follow up visit please address:  Follow-up Recommendations: Medications:   Please evaluate orthostatic hypotension and hypoglycemia. We stopped Coreg , chlorthalidone, valsartan, and pioglitazone due to concerns these are contributing to falls. She was stable in hospital without these medicines.  Please evaluate L lower leg with chronic venous insufficiency and a treated cellulitis, (linezolid ). She is discharged with compression wrappings.  Please evaluate abdominal intertrigo. She is discharged on nystatin .  Follow-up Appointments:  Contact information for after-discharge care     Destination     [facility] Preferred SNF .   Service: Skilled Nursing Contact information: [address] [place]  [id number] [phone]                     HOSPITAL COURSE:  Patient Summary: Ground-level fall Physical deconditioning Presented after a mechanical fall at home and being found down for  several days, unable to lift herself up due to generalized weakness.  She did not lose consciousness, denies any syncopal symptoms.  She reports general decline in functional status over several preceding weeks to months.  She had a CT head, CT abdomen pelvis, x-rays of both shoulders and chest, none of which demonstrated any acute traumatic injuries.  She worked with physical and occupational therapy while here, who recommended going to a skilled nursing facility for further rehabilitation.  We checked orthostatics, however they were inconclusive given patient inability to tolerate the activity.   Polypharmacy She was on several antihypertensive agents at home: Chlorthalidone, Farxiga , valsartan, and Coreg .  Her blood pressure has remained normotensive to borderline hypotensive throughout her hospital stay despite holding these medications.  In addition, she is on several  antiglycemic medications: Farxiga , Humulin, and pioglitazone.  Her fasting blood sugars have remained near goal despite holding these medications during her hospital stay.  I suspect that her falls and general weakness may be exacerbated by this polypharmacy, she does not appear to need these antihypertensive and antiglycemic agents.  We will discontinue these at discharge and have her follow-up with her primary care doctor outpatient to reassess whether she should continue any of these medications.   Left leg cellulitis Venous insufficiency, with dermatitis and ulceration of L leg Intertrigo Pressure injuries She presented with erythematous, confluent skin changes in the left lower extremity concerning for left leg cellulitis.  She was treated with 7 days of linezolid .  She remained afebrile throughout her hospital stay.  She had a leukocytosis which improved during the course of her hospital stay.  The wound care team was consulted for management of her leg wound in addition to her multiple pressure injuries and assisted with this  during her hospital stay.  She will need to continue wound care in the skilled nursing facility, but has completed her antibiotics.  She was also found to have intertrigo in her abdominal folds, so was started on nystatin  powder for this, which she can continue at discharge.  She may benefit from compression stockings and other interventions to help her with her chronic venous insufficiency in her legs.  I suspect her pressure injuries will improve as she regains her mobility and strength through therapy and is no longer spending so much time laying on them.    Sick sinus syndrome Left bundle branch block Prolonged Qtc Bradycardia Her initial EKG on presentation showed possible left bundle branch block and prolonged Qtc.  She did not have any chest pain, palpitations, lightheadedness or dizziness on arrival to the hospital.  Given her symptoms, we do not have concerns for a cardiac event, however we did interrogate the pacemaker.  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  It also showed some AF/AT episodes and ventricular tachycardia, however these were reviewed by the electrophysiology team and were all remote episodes, with ventricular rates in the 60-80s. We provided her with education about the pacemaker and she is agreeable to follow up with her previous cardiologist, Dr. Parks Bollman.  She was also bradycardic on presentation, so we held her Coreg , and her heart rate has been within normal limits since then.  We do not suspect a cardiogenic etiology for her falls and weakness.  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  It also demonstrated a heterogenous debris collection in  the left collecting system, renal cyst, and possible rounded solid masses in her kidneys.  She was asymptomatic from urinary standpoint and urinalysis was not concerning for any acute infection or obstructive stones.  Renal ultrasound was done which was limited by body habitus, however suggested that these masses may be cystic in nature, however it was not conclusive.  We spoke with urology, who reviewed the images and recommended no current intervention and outpatient follow-up with them in clinic.  The patient states that she has known about these stones for many years and occasionally has stones passes, but usually they pass easily with minimal pain.  She  also follows regularly with nephrology outpatient.  Type 2 diabetes mellitus Hemoglobin A1c is 6.7%.  As mentioned above, she is on several antiglycemic agents, however her fasting sugars have remained near goal despite holding all of these medications.  We will continue Farxiga  at discharge, but will hold the insulin  and pioglitazone.  She can follow-up with her outpatient doctor about whether she would benefit from adding back any additional antiglycemic agents.  Hypertension As mentioned above, she is on several antihypertensive agents including carvedilol , valsartan, and chlorthalidone.  Her blood pressure has remained normotensive, and at times, slightly hypotensive throughout her hospital stay.  Therefore we held these medications and will continue to do so at discharge.  I suspect she may not need to take this many antihypertensive agents, however this will be at the discretion of her outpatient doctors.   DISCHARGE INSTRUCTIONS:   Discharge Instructions     Call MD for:  extreme fatigue   Complete by: As directed    Call MD for:  persistant dizziness or light-headedness   Complete by: As directed    Call MD for:  severe uncontrolled pain   Complete by: As directed    Call MD for:  temperature >100.4   Complete by: As directed    Diet  general   Complete by: As directed    Discharge instructions   Complete by: As directed    You were hospitalized for a fall. It was in part due to low blood pressure and low blood sugar. In order to prevent recurrence, please stop carvedilol , chlorthalidone, valsartan, and pioglitazone indefinitely unless instructed to resume by your doctor. Otherwise, always use cane/walker and clear your surroundings of trip hazards. Please contact your primary care doctor to schedule a post-hospitalization follow-up appointment.   Discharge wound care:   Complete by: As directed    Cleanse B lower legs (intact skin and wounds) with Vashe wound cleanser Timm Foot [phone]) do not rinse and allow to air dry.  Apply Xeroform gauze to wound beds daily Timm Foot [phone]), cover with ABD pad and secure with Kerlix roll gauze starting right above toes and ending right below knees.   Apply Ace bandage wrapped in same fashion as Kerlix for light compression.   Cleanse L outer hip wound with Vashe, apply Xeroform Timm Foot [phone]) gauze daily and cover with silicone foam or ABD pad and tape.   Increase activity slowly   Complete by: As directed        SUBJECTIVE:  She feels well. No complaints. No pain. Notes her current leg dressing does not include the feet, will ask nurses to re-wrap. Agreeable for discharge to SNF.  Discharge Vitals:   BP (!) 124/56 (BP Location: Right Arm)   Pulse 75   Temp 97.7 F (36.5 C) (Oral)   Resp 18   Ht 5\' 4"  (1.626 m)   Wt 117.9 kg   SpO2 97%   BMI 44.63 kg/m   OBJECTIVE:  Physical Exam Constitutional:      General: She is not in acute distress.    Appearance: Normal appearance. She is obese. She is not ill-appearing.  Cardiovascular:     Rate and Rhythm: Normal rate.  Pulmonary:     Effort: Pulmonary effort is normal.  Abdominal:     General: Abdomen is flat.     Tenderness: There is no abdominal tenderness.  Musculoskeletal:        General: No tenderness.     Right lower  leg: No edema.  Left lower leg: Edema present.     Comments: LLE in ACE wrap. Good wrapping of leg but not on feet, will ask for re-wrap to include the foot.  Skin:    General: Skin is warm and dry.     Capillary Refill: Capillary refill takes less than 2 seconds.     Findings: No erythema or rash.  Neurological:     General: No focal deficit present.     Mental Status: She is alert and oriented to person, place, and time.     Pertinent Labs, Studies, and Procedures:     Latest Ref Rng & Units 06/19/2023    4:30 AM 06/18/2023    8:40 AM 06/17/2023    7:34 AM  CBC  WBC 4.0 - 10.5 K/uL 9.2  11.7  9.1   Hemoglobin 12.0 - 15.0 g/dL 60.4  54.0  98.1   Hematocrit 36.0 - 46.0 % 37.5  42.3  39.5   Platelets 150 - 400 K/uL 199  250  212        Latest Ref Rng & Units 06/17/2023    7:34 AM 06/16/2023    6:26 AM 06/15/2023    5:04 AM  CMP  Glucose 70 - 99 mg/dL 191  478  295   BUN 8 - 23 mg/dL 34  39  42   Creatinine 0.44 - 1.00 mg/dL 6.21  3.08  6.57   Sodium 135 - 145 mmol/L 140  144  139   Potassium 3.5 - 5.1 mmol/L 4.0  4.4  4.0   Chloride 98 - 111 mmol/L 110  110  111   CO2 22 - 32 mmol/L 25  26  22    Calcium  8.9 - 10.3 mg/dL 8.5  8.7  8.3     VAS US  LOWER EXTREMITY VENOUS (DVT) (7a-7p) Result Date: 06/13/2023  Lower Venous DVT Study Patient Name:  EARA BURRUEL  Date of Exam:   06/12/2023 Medical Rec #: [id number]   Accession #:    [id number] Date of Birth: December 27, 1950    Patient Gender: F Patient Age:   61 years Exam Location:  [facility] Procedure:      VAS US  LOWER EXTREMITY VENOUS (DVT) Referring Phys: MELANIE BELFI --------------------------------------------------------------------------------  Indications: Patient fell onto left side 06/10/23 and was in floor until found down today. Multiple open and weeping wounds on legs, elevated CK  Limitations: Body habitus and Weeping, open wounds on calf. Comparison Study: Prior negative left LEV done 05/21/2021 at [facility]  Performing Technologist: Carleene Chase RVS  Examination Guidelines: A complete evaluation includes B-mode imaging, spectral Doppler, color Doppler, and power Doppler as needed of all accessible portions of each vessel. Bilateral testing is considered an integral part of a complete examination. Limited examinations for reoccurring indications may be performed as noted. The reflux portion of the exam is performed with the patient in reverse Trendelenburg.  +-----+---------------+---------+-----------+----------+--------------+ RIGHTCompressibilityPhasicitySpontaneityPropertiesThrombus Aging +-----+---------------+---------+-----------+----------+--------------+ CFV  Full           Yes      Yes                                 +-----+---------------+---------+-----------+----------+--------------+ SFJ  Full                                                        +-----+---------------+---------+-----------+----------+--------------+   +---------+---------------+---------+-----------+----------+-------------------+  LEFT     CompressibilityPhasicitySpontaneityPropertiesThrombus Aging      +---------+---------------+---------+-----------+----------+-------------------+ CFV      Full           Yes      Yes                                      +---------+---------------+---------+-----------+----------+-------------------+ SFJ      Full                                                             +---------+---------------+---------+-----------+----------+-------------------+ FV Prox  Full           Yes      Yes                                      +---------+---------------+---------+-----------+----------+-------------------+ FV Mid   Full                                                             +---------+---------------+---------+-----------+----------+-------------------+ FV DistalFull                                                              +---------+---------------+---------+-----------+----------+-------------------+ PFV      Full           Yes      Yes                                      +---------+---------------+---------+-----------+----------+-------------------+ POP      Full           Yes      Yes                                      +---------+---------------+---------+-----------+----------+-------------------+ PTV      Full                                         Visualization of                                                          proximal portion  only                +---------+---------------+---------+-----------+----------+-------------------+ PERO     Full                                         Visualization of                                                          proximal portion                                                          only                +---------+---------------+---------+-----------+----------+-------------------+     Summary: RIGHT: - No evidence of common femoral vein obstruction.   LEFT: - There is no evidence of deep vein thrombosis in the visualized veins of the left lower extremity.  - No cystic structure found in the popliteal fossa.  *See table(s) above for measurements and observations. Electronically signed by Irvin Mantel on 06/13/2023 at 7:38:44 PM.    Final    ECHOCARDIOGRAM COMPLETE Result Date: 06/13/2023    ECHOCARDIOGRAM REPORT   Patient Name:   Kailene Steinhart Date of Exam: 06/13/2023 Medical Rec #:  [id number]  Height:       64.0 in Accession #:    [id number] Weight:       260.0 lb Date of Birth:  1950-11-30   BSA:          2.187 m Patient Age:    73 years   BP:           132/49 mmHg Patient Gender: F          HR:           74 bpm. Exam Location:  Inpatient Procedure: 2D Echo, Color Doppler and Cardiac Doppler (Both Spectral and Color            Flow Doppler were utilized during  procedure). Indications:    R01.1 Murmur  History:        Patient has no prior history of Echocardiogram examinations.                 Pacemaker; Risk Factors:Hypertension and Diabetes.  Sonographer:    Sherline Distel Senior RDCS Referring Phys: Jackolyn Masker  Sonographer Comments: Technically difficult due to patient body habitus IMPRESSIONS  1. Left ventricular ejection fraction, by estimation, is 60 to 65%. The left ventricle has normal function. The left ventricle has no regional wall motion abnormalities. There is mild left ventricular hypertrophy. Left ventricular diastolic parameters are consistent with Grade I diastolic dysfunction (impaired relaxation).  2. Right ventricular systolic function is normal. The right ventricular size is normal.  3. The mitral valve is normal in structure. Trivial mitral valve regurgitation. No evidence of mitral stenosis.  4. The aortic valve has an indeterminant number of cusps. Aortic valve regurgitation is not visualized. Mild aortic valve stenosis.  5. The inferior vena cava is normal in size with greater than 50%  respiratory variability, suggesting right atrial pressure of 3 mmHg. Comparison(s): No prior Echocardiogram. FINDINGS  Left Ventricle: Left ventricular ejection fraction, by estimation, is 60 to 65%. The left ventricle has normal function. The left ventricle has no regional wall motion abnormalities. Definity  contrast agent was given IV to delineate the left ventricular  endocardial borders. The left ventricular internal cavity size was normal in size. There is mild left ventricular hypertrophy. Left ventricular diastolic parameters are consistent with Grade I diastolic dysfunction (impaired relaxation). Right Ventricle: The right ventricular size is normal. Right ventricular systolic function is normal. Left Atrium: Left atrial size was normal in size. Right Atrium: Right atrial size was normal in size. Pericardium: There is no evidence of pericardial effusion. Mitral  Valve: The mitral valve is normal in structure. Mild mitral annular calcification. Trivial mitral valve regurgitation. No evidence of mitral valve stenosis. Tricuspid Valve: The tricuspid valve is normal in structure. Tricuspid valve regurgitation is trivial. No evidence of tricuspid stenosis. Aortic Valve: The aortic valve has an indeterminant number of cusps. Aortic valve regurgitation is not visualized. Mild aortic stenosis is present. Aortic valve mean gradient measures 13.0 mmHg. Aortic valve peak gradient measures 24.6 mmHg. Aortic valve  area, by VTI measures 1.24 cm. Pulmonic Valve: The pulmonic valve was normal in structure. Pulmonic valve regurgitation is not visualized. No evidence of pulmonic stenosis. Aorta: The aortic root is normal in size and structure. Venous: The inferior vena cava is normal in size with greater than 50% respiratory variability, suggesting right atrial pressure of 3 mmHg. IAS/Shunts: No atrial level shunt detected by color flow Doppler. Additional Comments: A device lead is visualized.  LEFT VENTRICLE PLAX 2D LVIDd:         4.30 cm   Diastology LVIDs:         3.60 cm   LV e' medial:    5.11 cm/s LV PW:         1.20 cm   LV E/e' medial:  14.6 LV IVS:        1.40 cm   LV e' lateral:   8.81 cm/s LVOT diam:     1.85 cm   LV E/e' lateral: 8.4 LV SV:         65 LV SV Index:   30 LVOT Area:     2.69 cm  RIGHT VENTRICLE RV S prime:     12.70 cm/s TAPSE (M-mode): 2.0 cm LEFT ATRIUM             Index        RIGHT ATRIUM           Index LA diam:        3.90 cm 1.78 cm/m   RA Area:     16.70 cm LA Vol (A2C):   85.1 ml 38.91 ml/m  RA Volume:   41.80 ml  19.11 ml/m LA Vol (A4C):   51.7 ml 23.64 ml/m LA Biplane Vol: 68.0 ml 31.09 ml/m  AORTIC VALVE AV Area (Vmax):    1.22 cm AV Area (Vmean):   1.40 cm AV Area (VTI):     1.24 cm AV Vmax:           248.00 cm/s AV Vmean:          167.000 cm/s AV VTI:            0.527 m AV Peak Grad:      24.6 mmHg AV Mean Grad:      13.0 mmHg LVOT Vmax:  113.00 cm/s LVOT Vmean:        86.800 cm/s LVOT VTI:          0.243 m LVOT/AV VTI ratio: 0.46  AORTA Ao Asc diam: 3.10 cm MITRAL VALVE MV Area (PHT): 2.53 cm     SHUNTS MV Decel Time: 300 msec     Systemic VTI:  0.24 m MV E velocity: 74.40 cm/s   Systemic Diam: 1.85 cm MV A velocity: 133.00 cm/s MV E/A ratio:  0.56 Alexandria Angel MD Electronically signed by Alexandria Angel MD Signature Date/Time: 06/13/2023/11:43:48 AM    Final    US  RENAL Result Date: 06/13/2023 CLINICAL DATA:  Renal mass workup. EXAM: RENAL / URINARY TRACT ULTRASOUND COMPLETE COMPARISON:  Abdominal CT from yesterday FINDINGS: Right Kidney: Renal measurements: 11 x 6 x 5 cm = volume: 175 mL. No gross mass lesion or hydronephrosis. Left Kidney: Not visible Bladder: Appears normal for degree of bladder distention. IMPRESSION: Body habitus and inability to cooperate with the exam is a significant limiting factor, patient's right renal lesion by CT is not localized and the entire left kidney is non visible. Electronically Signed   By: Ronnette Coke M.D.   On: 06/13/2023 05:47   CT Head Wo Contrast Result Date: 06/12/2023 CLINICAL DATA:  Marvell Slider, head trauma EXAM: CT HEAD WITHOUT CONTRAST TECHNIQUE: Contiguous axial images were obtained from the base of the skull through the vertex without intravenous contrast. RADIATION DOSE REDUCTION: This exam was performed according to the departmental dose-optimization program which includes automated exposure control, adjustment of the mA and/or kV according to patient size and/or use of iterative reconstruction technique. COMPARISON:  None Available. FINDINGS: Brain: No acute infarct or hemorrhage. Lateral ventricles and midline structures are unremarkable. No acute extra-axial fluid collections. No mass effect. Vascular: No hyperdense vessel or unexpected calcification. Skull: Left temporal scalp edema.  No underlying fracture. Sinuses/Orbits: Opacification of the left maxillary sinus with thickening  of the bony margins consistent with chronic sinus disease. Mild mucosal thickening within the ethmoid air cells. Other: None. IMPRESSION: 1. No acute intracranial process. 2. Left temporal scalp edema.  No underlying fracture. Electronically Signed   By: Bobbye Burrow M.D.   On: 06/12/2023 16:51   CT ABDOMEN PELVIS WO CONTRAST Result Date: 06/12/2023 CLINICAL DATA:  Marvell Slider. Multiple chronic wounds on her lower extremities and back. Generalized weakness. EXAM: CT ABDOMEN AND PELVIS WITHOUT CONTRAST TECHNIQUE: Multidetector CT imaging of the abdomen and pelvis was performed following the standard protocol without IV contrast. RADIATION DOSE REDUCTION: This exam was performed according to the departmental dose-optimization program which includes automated exposure control, adjustment of the mA and/or kV according to patient size and/or use of iterative reconstruction technique. COMPARISON:  None Available. FINDINGS: Lower chest: Intracardiac pacer leads. Borderline enlarged heart. Minimal left basilar atelectasis. Hepatobiliary: Possible noncalcified gallstones in the dependent portion of the gallbladder. No gallbladder wall thickening or pericholecystic fluid. Normal-appearing liver. Pancreas: Unremarkable. No pancreatic ductal dilatation or surrounding inflammatory changes. Spleen: Normal in size without focal abnormality. Adrenals/Urinary Tract: Normal-appearing adrenal gland. Large number of calculi in the left renal collecting system with mild-to-moderate dilatation of the collecting system. The collecting system is filled with medium density material measuring 39 Hounsfield units in density. 1 cm rounded, mildly heterogeneous, high density mass in the medial right kidney. 2 1.4 cm partially exophytic, possible rounded solid masses in the lateral left kidney. Unremarkable ureters and urinary bladder. Stomach/Bowel: Knuckle of transverse colon in a moderate-sized umbilical hernia without wall thickening or  obstruction. Normal-appearing stomach, small bowel and appendix. Vascular/Lymphatic: No significant vascular findings are present. No enlarged abdominal or pelvic lymph nodes. Reproductive: Enlarged, mildly irregular uterus.  No adnexal masses. Other: Moderate-sized umbilical hernia containing herniated fat and a knuckle of herniated transverse colon without obstruction. Musculoskeletal: Extensive lumbar and lower thoracic spine degenerative changes. IMPRESSION: 1. Large number of calculi in the left renal collecting system with mild-to-moderate dilatation of the collecting system. The collecting system is filled with medium density material measuring 39 Hounsfield units in density. This could represent blood products, proteinaceous debris or products of chronic infection. 2. 1 cm rounded, mildly heterogeneous, high density mass in the medial right kidney. This could represent a hemorrhagic or proteinaceous cyst. A solid mass is not excluded. A renal ultrasound is recommended for further evaluation. 3. 2 1.4 cm partially exophytic, possible rounded solid masses in the lateral left kidney. These can also be evaluated with a renal ultrasound. 4. Possible noncalcified gallstones in the dependent portion of the gallbladder. 5. Moderate-sized umbilical hernia containing herniated fat and a knuckle of herniated transverse colon without obstruction. 6. Enlarged, mildly irregular uterus, likely due to fibroids. Electronically Signed   By: Catherin Closs M.D.   On: 06/12/2023 16:43   DG Shoulder Right Result Date: 06/12/2023 CLINICAL DATA:  Fall, shoulder pain EXAM: RIGHT SHOULDER - 2+ VIEW COMPARISON:  None Available. FINDINGS: Degenerative glenohumeral arthropathy with loss of articular space and spurring although less striking than on the left. Mild degenerative AC joint spurring. No fracture or malalignment. Suboptimal transscapular projection due to patient body habitus. IMPRESSION: 1. 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 No fracture or malalignment is identified. Electronically Signed   By: Freida Jes M.D.   On: 06/12/2023 14:08   DG Shoulder Left Result Date: 06/12/2023 CLINICAL DATA:  Fall, bilateral shoulder pain EXAM: LEFT SHOULDER - 2+ VIEW COMPARISON:  None Available. FINDINGS: Severe degenerative glenohumeral arthropathy with prominent spurring noted. Mild degenerative AC joint spurring. An ECG snap per projects directly over the left humeral head and glenohumeral joint which is suboptimal, and body habitus also reduces diagnostic sensitivity and specificity. However, I do not see a definite acute fracture or malalignment. Dual lead pacer noted. IMPRESSION: 1. Severe degenerative glenohumeral arthropathy/spurring. 2. Mild degenerative AC joint spurring. 3. No definite acute fracture or malalignment. Electronically Signed   By: Freida Jes M.D.   On: 06/12/2023 14:07   DG Chest Port 1 View Result Date: 06/12/2023 CLINICAL DATA:  Questionable sepsis - evaluate for abnormality EXAM: PORTABLE CHEST 1 VIEW COMPARISON:  None Available. FINDINGS: Evaluation is limited by rotation. The cardiomediastinal silhouette is mildly enlarged in contour.LEFT chest cardiac pacing device. No pleural effusion. No pneumothorax. No acute pleuroparenchymal abnormality. Subcortical sclerosis of the LEFT glenohumeral joint with joint space narrowing and osteophyte formation most consistent with degenerative changes. IMPRESSION: No acute cardiopulmonary abnormality. Electronically Signed   By: Clancy Crimes M.D.   On: 06/12/2023 13:16     Signed: Carleen Chary, DO Internal Medicine Resident, PGY-1 [facility]  2:01 PM, 06/21/2023

## 2023-06-21 NOTE — TOC Transition Note (Signed)
 Transition of Care [facility]) - Discharge Note   Patient Details  Name: Debbie Wiggins MRN: [id number] Date of Birth: 03-31-1950  [facility] CM/SW Contact:  Jailey Booton A [place], LCSW Phone Number: 06/21/2023, 1:01 PM   Clinical Narrative:      Patient will DC to: [facility] [place]  Anticipated DC date: 06/21/23  Family notified: [name], pt's sister  Transport byLyna Sandhoff  Reference ID #[id number]   Approval Dates:  06/20/23- 63/25   Per MD patient ready for DC to [facility] [place]. RN, patient, patient's family, and facility notified of DC. Discharge Summary and FL2 sent to facility. RN to call report prior to discharge [phone], Room 808). DC packet on chart. Ambulance transport requested for patient.     CSW will sign off for now as social work intervention is no longer needed. Please consult us  again if new needs arise.   Final next level of care: Skilled Nursing Facility Barriers to Discharge: Barriers Resolved   Patient Goals and CMS Choice            Discharge Placement              Patient chooses bed at: [facility] [place] Patient to be transferred to facility by: [facility] Name of family member notified: Kim Patient and family notified of of transfer: 06/21/23  Discharge Plan and Services Additional resources added to the After Visit Summary for                                       Social Drivers of Health (SDOH) Interventions SDOH Screenings   Food Insecurity: Food Insecurity Present (06/12/2023)  Housing: Low Risk  (06/12/2023)  Transportation Needs: Unmet Transportation Needs (06/12/2023)  Utilities: Not At Risk (06/12/2023)  Financial Resource Strain: Patient Declined (10/12/2022)   Received from [facility]  Physical Activity: Unknown (12/08/2017)   [facility] from [facility]  Social Connections: Moderately Integrated (06/12/2023)  Tobacco Use: Low Risk   (06/12/2023)     Readmission Risk Interventions     No data to display

## 2023-07-30 ENCOUNTER — Ambulatory Visit (INDEPENDENT_AMBULATORY_CARE_PROVIDER_SITE_OTHER): Admitting: Podiatry

## 2023-07-30 DIAGNOSIS — Z91199 Patient's noncompliance with other medical treatment and regimen due to unspecified reason: Secondary | ICD-10-CM

## 2023-07-30 NOTE — Progress Notes (Signed)
 1. No-show for appointment
